# Patient Record
Sex: Female | Born: 1950 | Race: White | Hispanic: No | Marital: Married | Smoking: Never smoker
Health system: Southern US, Community
[De-identification: ages and names within clinical notes are randomized; demographics above are authoritative.]

## PROBLEM LIST (undated history)

## (undated) DIAGNOSIS — G5601 Carpal tunnel syndrome, right upper limb: Secondary | ICD-10-CM

## (undated) DIAGNOSIS — M2550 Pain in unspecified joint: Secondary | ICD-10-CM

## (undated) DIAGNOSIS — E039 Hypothyroidism, unspecified: Secondary | ICD-10-CM

## (undated) DIAGNOSIS — C9590 Leukemia, unspecified not having achieved remission: Secondary | ICD-10-CM

## (undated) DIAGNOSIS — E041 Nontoxic single thyroid nodule: Secondary | ICD-10-CM

## (undated) HISTORY — DX: Nontoxic single thyroid nodule: E04.1

## (undated) HISTORY — PX: NO PAST SURGERIES: SHX2092

## (undated) HISTORY — DX: Pain in unspecified joint: M25.50

## (undated) HISTORY — DX: Leukemia, unspecified not having achieved remission: C95.90

## (undated) HISTORY — DX: Hypothyroidism, unspecified: E03.9

## (undated) HISTORY — DX: Carpal tunnel syndrome, right upper limb: G56.01

---

## 1999-05-02 ENCOUNTER — Other Ambulatory Visit: Admission: RE | Admit: 1999-05-02 | Discharge: 1999-05-02 | Payer: Self-pay | Admitting: Family Medicine

## 1999-05-28 ENCOUNTER — Ambulatory Visit (HOSPITAL_COMMUNITY): Admission: RE | Admit: 1999-05-28 | Discharge: 1999-05-28 | Payer: Self-pay | Admitting: Family Medicine

## 1999-05-28 ENCOUNTER — Encounter: Payer: Self-pay | Admitting: Family Medicine

## 2017-01-27 ENCOUNTER — Telehealth: Payer: Self-pay | Admitting: Neurology

## 2017-01-27 NOTE — Telephone Encounter (Signed)
THIS PATIENT HAS BEEN R/S FOR SOONER EMG PER YOUR REQUEST. APPT IS WITH [facility] 01/29/17.

## 2017-01-29 ENCOUNTER — Encounter: Payer: Self-pay | Admitting: Neurology

## 2017-01-29 ENCOUNTER — Ambulatory Visit (INDEPENDENT_AMBULATORY_CARE_PROVIDER_SITE_OTHER): Payer: BC Managed Care – PPO | Admitting: Neurology

## 2017-01-29 ENCOUNTER — Ambulatory Visit (INDEPENDENT_AMBULATORY_CARE_PROVIDER_SITE_OTHER): Payer: Self-pay | Admitting: Neurology

## 2017-01-29 DIAGNOSIS — G5601 Carpal tunnel syndrome, right upper limb: Secondary | ICD-10-CM

## 2017-01-29 HISTORY — DX: Carpal tunnel syndrome, right upper limb: G56.01

## 2017-01-29 NOTE — Procedures (Signed)
     HISTORY:  Heather Solis is a 66 year old patient with a one-month history of discomfort and numbness involving the right hand. The patient denies any neck pain or pain radiating down the right arm to the hand. The discomfort is worse in the evening hours and keeps her awake at night. More recently, she started to have similar symptoms on the left arm and hand. The patient is being evaluated for a possible neuropathy or a cervical radiculopathy.  NERVE CONDUCTION STUDIES:  Nerve conduction studies were performed on both upper extremities. The distal motor latencies for the median nerves were prolonged on the right, normal on the left, with a borderline normal motor amplitude on the right, normal on the left. The distal motor latencies and motor amplitudes for the ulnar nerves bilaterally were normal. The F wave latencies and nerve conduction velocities for the median nerves bilaterally and for the ulnar nerves were normal. Sensory latencies for the median nerves were prolonged on the right, normal on the left, and normal for the ulnar nerves bilaterally.  EMG STUDIES:  EMG study was performed on the right upper extremity:  The first dorsal interosseous muscle reveals 2 to 4 K units with full recruitment. No fibrillations or positive waves were noted. The abductor pollicis brevis muscle reveals 2 to 4 K units with full recruitment. No fibrillations or positive waves were noted. The extensor indicis proprius muscle reveals 1 to 3 K units with full recruitment. No fibrillations or positive waves were noted. The pronator teres muscle reveals 2 to 3 K units with full recruitment. No fibrillations or positive waves were noted. The biceps muscle reveals 1 to 2 K units with full recruitment. No fibrillations or positive waves were noted. The triceps muscle reveals 2 to 4 K units with full recruitment. No fibrillations or positive waves were noted. The anterior deltoid muscle reveals 2 to 3 K units  with full recruitment. No fibrillations or positive waves were noted. The cervical paraspinal muscles were tested at 2 levels. No abnormalities of insertional activity were seen at either level tested. There was good relaxation.   IMPRESSION:  Nerve conduction studies done on both upper extremities reveal evidence of a mild right carpal tunnel syndrome. EMG evaluation of the right upper extremity was unremarkable, without evidence of an overlying cervical radiculopathy.  Jill Alexanders MD 01/29/2017 1:50 PM  [facility] [address] [place], [place] [address]  Phone [phone] Fax [phone]

## 2017-01-29 NOTE — Progress Notes (Signed)
Please refer to EMG and nerve conduction study procedure note. 

## 2017-02-14 ENCOUNTER — Other Ambulatory Visit: Payer: Self-pay | Admitting: Internal Medicine

## 2017-02-14 DIAGNOSIS — E042 Nontoxic multinodular goiter: Secondary | ICD-10-CM

## 2017-02-20 ENCOUNTER — Ambulatory Visit
Admission: RE | Admit: 2017-02-20 | Discharge: 2017-02-20 | Disposition: A | Discharge status: Home / Self Care | Payer: BC Managed Care – PPO | Source: Ambulatory Visit | Attending: Internal Medicine | Admitting: Internal Medicine

## 2017-02-20 DIAGNOSIS — E042 Nontoxic multinodular goiter: Secondary | ICD-10-CM

## 2017-02-24 ENCOUNTER — Other Ambulatory Visit: Payer: Self-pay | Admitting: Internal Medicine

## 2017-02-24 DIAGNOSIS — E041 Nontoxic single thyroid nodule: Secondary | ICD-10-CM

## 2017-02-25 ENCOUNTER — Ambulatory Visit (INDEPENDENT_AMBULATORY_CARE_PROVIDER_SITE_OTHER): Payer: BC Managed Care – PPO | Admitting: Neurology

## 2017-02-25 ENCOUNTER — Encounter: Payer: Self-pay | Admitting: Neurology

## 2017-02-25 ENCOUNTER — Telehealth: Payer: Self-pay | Admitting: Neurology

## 2017-02-25 ENCOUNTER — Ambulatory Visit
Admission: RE | Admit: 2017-02-25 | Discharge: 2017-02-25 | Disposition: A | Discharge status: Home / Self Care | Payer: BC Managed Care – PPO | Source: Ambulatory Visit | Attending: Neurology | Admitting: Neurology

## 2017-02-25 ENCOUNTER — Encounter (INDEPENDENT_AMBULATORY_CARE_PROVIDER_SITE_OTHER): Payer: Self-pay

## 2017-02-25 DIAGNOSIS — M25541 Pain in joints of right hand: Secondary | ICD-10-CM

## 2017-02-25 DIAGNOSIS — M2550 Pain in unspecified joint: Secondary | ICD-10-CM | POA: Insufficient documentation

## 2017-02-25 HISTORY — DX: Pain in unspecified joint: M25.50

## 2017-02-25 MED ORDER — HYDROMORPHONE HCL 2 MG PO TABS: 2.0000 mg | ORAL_TABLET | Freq: Four times a day (QID) | ORAL | 0 refills | Status: DC | PRN

## 2017-02-25 NOTE — Telephone Encounter (Signed)
I called patient. X-ray the hand is normal, no evidence of bony destruction, consider another etiology such as scleroderma. The patient will be seen a rheumatologist Dr.

## 2017-02-25 NOTE — Progress Notes (Signed)
Reason for visit: Arm weakness  Referring physician: Dr. Tonye Pearson is a 66 y.o. female  History of present illness:  Ms. Altheide is a 66 year old white female with a history of onset of discomfort in the right hand and the shoulders that began in December 2017. The patient has had some numbness sensations on the right hand more so than the left, she has been sent here for EMG and nerve conduction study evaluation that showed evidence of a mild right carpal tunnel syndrome without evidence of an overlying cervical radiculopathy. The patient is not have carpal tunnel syndrome on the left. The patient has some minor discomfort in the neck, she denies any low back pain. She has had some pain when she tries to elevate the arms, with discomfort into the shoulders and upper arms. The patient has essentially lost the use of her right hand, with swelling and discomfort in the joints of the fingers, and inability to fully close the hand to make a fist. The patient has a history of CML, she has been on [place] for this. The patient has had some elevation in white blood count recently, but there is no evidence of recurrence of the CML. The patient denies any balance changes, difficulty controlling the bowels or the bladder, the patient denies any headaches. She is sent to this office for further evaluation. She has undergone MRI of the cervical spine that did not show evidence of nerve root impingement or spinal cord impingement.  Past Medical History:  Diagnosis Date  . Arthralgia 02/25/2017  . Carpal tunnel syndrome on right 01/29/2017  . Hypothyroid   . 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 Thyroid cyst     Past Surgical History:  Procedure Laterality Date  . NO PAST SURGERIES      Family History  Problem Relation Age of Onset  . Lung cancer Father     Social history:  reports that she has never smoked. She has never used smokeless tobacco. She reports that she does not drink alcohol or use  drugs.  Medications:  Prior to Admission medications   Medication Sig Start Date End Date Taking? Authorizing Provider  calcium carbonate (CALCIUM 600) 600 MG TABS tablet Take 1 tablet by mouth daily.   Yes Historical Provider, MD  cetirizine (ZYRTEC) 10 MG tablet Take 10 mg by mouth daily.   Yes Historical Provider, MD  levothyroxine (SYNTHROID, LEVOTHROID) 125 MCG tablet Take 125 mcg by mouth daily. 01/23/17  Yes Historical Provider, MD  HYDROmorphone (DILAUDID) 2 MG tablet Take 1 tablet (2 mg total) by mouth every 6 (six) hours as needed for severe pain. 02/25/17   Kathrynn Ducking, MD     Not on File  ROS:  Out of a complete 14 system review of symptoms, the patient complains only of the following symptoms, and all other reviewed systems are negative.  Numbness, weakness Sleepiness Achy muscles Runny nose  Blood pressure (!) 148/80, pulse 81, height 5\' 3"  (1.6 m), weight 133 lb 8 oz (60.6 kg).  Physical Exam  General: The patient is alert and cooperative at the time of the examination.  Eyes: Pupils are equal, round, and reactive to light. Discs are flat bilaterally.  Neck: The neck is supple, no carotid bruits are noted.  Respiratory: The respiratory examination is clear.  Cardiovascular: The cardiovascular examination reveals a regular rate and rhythm, no obvious murmurs or rubs are noted.  Neuromuscular: The right hand is warm,  the fingers are swollen, stiff. There is decreased range of movement with flexion of the fingers on the right hand. With internal and external rotation of the shoulder joints, there is significant discomfort.  Skin: Extremities are without significant edema.  Neurologic Exam  Mental status: The patient is alert and oriented x 3 at the time of the examination. The patient has apparent normal recent and remote memory, with an apparently normal attention span and concentration ability.  Cranial nerves: Facial symmetry is present. There is good  sensation of the face to pinprick and soft touch bilaterally. The strength of the facial muscles and the muscles to head turning and shoulder shrug are normal bilaterally. Speech is well enunciated, no aphasia or dysarthria is noted. Extraocular movements are full. Visual fields are full. The tongue is midline, and the patient has symmetric elevation of the soft palate. No obvious hearing deficits are noted.  Motor: The motor testing reveals 5 over 5 strength of all 4 extremities. Good symmetric motor tone is noted throughout.  Sensory: Sensory testing is intact to pinprick, soft touch, vibration sensation, and position sense on all 4 extremities. No evidence of extinction is noted.  Coordination: Cerebellar testing reveals good finger-nose-finger and heel-to-shin bilaterally.  Gait and station: Gait is normal. Tandem gait is normal. Romberg is negative. No drift is seen.  Reflexes: Deep tendon reflexes are symmetric and normal bilaterally. Toes are downgoing bilaterally.   Assessment/Plan:  1. Arthralgias, arthritis  The patient does have mild right carpal tunnel syndrome, but her current symptoms are related to a rheumatologic issue involving the right hand predominantly, but also involving the shoulders. The patient will need to be evaluated for this issue, blood work will be done today, will get an x-ray of the right hand, and the patient will remain on [place] taking 2 tablets twice daily. The patient will be given Dilaudid for the discomfort, she could not tolerate Ultram previously.  Jill Alexanders MD 02/25/2017 8:39 AM  [facility] [address] [place], [place] [address]  Phone [phone] Fax [phone]

## 2017-02-26 ENCOUNTER — Telehealth: Payer: Self-pay | Admitting: *Deleted

## 2017-02-26 LAB — SEDIMENTATION RATE: Sed Rate: 11 mm/hr (ref 0–40)

## 2017-02-26 LAB — PAN-ANCA
ANCA Proteinase 3: <3.5 U/mL (ref 0.0–3.5)
C-ANCA: <1:20 {titer}
Myeloperoxidase Ab: <9 U/mL (ref 0.0–9.0)

## 2017-02-26 LAB — ANA W/REFLEX: Anti Nuclear Antibody(ANA): NEGATIVE

## 2017-02-26 LAB — B. BURGDORFI ANTIBODIES: Lyme IgG/IgM Ab: <0.91 {ISR} (ref 0.00–0.90)

## 2017-02-26 LAB — ANGIOTENSIN CONVERTING ENZYME: ANGIO CONVERT ENZYME: 63 U/L (ref 14–82)

## 2017-02-26 LAB — RHEUMATOID FACTOR

## 2017-02-26 NOTE — Telephone Encounter (Addendum)
Called and spoke with pt about unremarkable labs per CW,MD note. She verbalized understanding.   I gave her phone number to [facility] office to call if needed about rheumatology referral: Telephone [phone]. She may call back with different name/office to be referred to if she can get in sooner somewhere else.

## 2017-02-26 NOTE — Telephone Encounter (Signed)
-----   Message from Kathrynn Ducking, MD sent at 02/26/2017  4:52 PM EST -----  The blood work results are unremarkable. Please call the patient.  ----- Message ----- From: Lavone Neri Lab Results In Sent: 02/26/2017   7:43 AM To: Kathrynn Ducking, MD

## 2017-02-27 ENCOUNTER — Ambulatory Visit: Payer: BC Managed Care – PPO | Admitting: Neurology

## 2017-02-27 ENCOUNTER — Other Ambulatory Visit (HOSPITAL_COMMUNITY)
Admission: RE | Admit: 2017-02-27 | Discharge: 2017-02-27 | Disposition: A | Discharge status: Home / Self Care | Payer: BC Managed Care – PPO | Source: Ambulatory Visit | Attending: Internal Medicine | Admitting: Internal Medicine

## 2017-02-27 ENCOUNTER — Ambulatory Visit
Admission: RE | Admit: 2017-02-27 | Discharge: 2017-02-27 | Disposition: A | Discharge status: Home / Self Care | Payer: BC Managed Care – PPO | Source: Ambulatory Visit | Attending: Internal Medicine | Admitting: Internal Medicine

## 2017-02-27 DIAGNOSIS — E041 Nontoxic single thyroid nodule: Secondary | ICD-10-CM

## 2017-03-05 ENCOUNTER — Encounter: Payer: BC Managed Care – PPO | Admitting: Neurology

## 2017-03-14 ENCOUNTER — Ambulatory Visit: Payer: Self-pay | Admitting: Rheumatology

## 2017-03-17 ENCOUNTER — Ambulatory Visit (INDEPENDENT_AMBULATORY_CARE_PROVIDER_SITE_OTHER): Payer: Self-pay

## 2017-03-17 ENCOUNTER — Ambulatory Visit (INDEPENDENT_AMBULATORY_CARE_PROVIDER_SITE_OTHER): Payer: BC Managed Care – PPO | Admitting: Rheumatology

## 2017-03-17 ENCOUNTER — Ambulatory Visit (HOSPITAL_COMMUNITY)
Admission: RE | Admit: 2017-03-17 | Discharge: 2017-03-17 | Disposition: A | Discharge status: Home / Self Care | Payer: BC Managed Care – PPO | Source: Ambulatory Visit | Attending: Rheumatology | Admitting: Rheumatology

## 2017-03-17 ENCOUNTER — Encounter: Payer: Self-pay | Admitting: Rheumatology

## 2017-03-17 VITALS — BP 145/87 | HR 70 | Resp 12 | Ht 62.0 in | Wt 136.0 lb

## 2017-03-17 DIAGNOSIS — G8929 Other chronic pain: Secondary | ICD-10-CM | POA: Diagnosis not present

## 2017-03-17 DIAGNOSIS — M25512 Pain in left shoulder: Secondary | ICD-10-CM

## 2017-03-17 DIAGNOSIS — E039 Hypothyroidism, unspecified: Secondary | ICD-10-CM | POA: Diagnosis not present

## 2017-03-17 DIAGNOSIS — Z139 Encounter for screening, unspecified: Secondary | ICD-10-CM

## 2017-03-17 DIAGNOSIS — R5383 Other fatigue: Secondary | ICD-10-CM

## 2017-03-17 DIAGNOSIS — E041 Nontoxic single thyroid nodule: Secondary | ICD-10-CM | POA: Diagnosis not present

## 2017-03-17 DIAGNOSIS — M79642 Pain in left hand: Secondary | ICD-10-CM

## 2017-03-17 DIAGNOSIS — C9210 Chronic myeloid leukemia, BCR/ABL-positive, not having achieved remission: Secondary | ICD-10-CM

## 2017-03-17 DIAGNOSIS — M79641 Pain in right hand: Secondary | ICD-10-CM | POA: Diagnosis not present

## 2017-03-17 DIAGNOSIS — M25511 Pain in right shoulder: Secondary | ICD-10-CM

## 2017-03-17 LAB — CBC WITH DIFFERENTIAL/PLATELET
BASOS PCT: 0 %
Basophils Absolute: 0 cells/uL (ref 0–200)
EOS PCT: 2 %
Eosinophils Absolute: 152 cells/uL (ref 15–500)
HEMATOCRIT: 40.8 % (ref 35.0–45.0)
Hemoglobin: 13.5 g/dL (ref 11.7–15.5)
LYMPHS ABS: 2280 {cells}/uL (ref 850–3900)
Lymphocytes Relative: 30 %
MCH: 30 pg (ref 27.0–33.0)
MCHC: 33.1 g/dL (ref 32.0–36.0)
MCV: 90.7 fL (ref 80.0–100.0)
MONO ABS: 760 {cells}/uL (ref 200–950)
MPV: 9.6 fL (ref 7.5–12.5)
Monocytes Relative: 10 %
Neutro Abs: 4408 cells/uL (ref 1500–7800)
Neutrophils Relative %: 58 %
Platelets: 365 10*3/uL (ref 140–400)
RBC: 4.5 MIL/uL (ref 3.80–5.10)
RDW: 13.4 % (ref 11.0–15.0)
WBC: 7.6 10*3/uL (ref 3.8–10.8)

## 2017-03-17 LAB — COMPLETE METABOLIC PANEL WITH GFR
ALBUMIN: 4.3 g/dL (ref 3.6–5.1)
ALT: 12 U/L (ref 6–29)
AST: 17 U/L (ref 10–35)
Alkaline Phosphatase: 75 U/L (ref 33–130)
BUN: 15 mg/dL (ref 7–25)
CALCIUM: 10.6 mg/dL — AB (ref 8.6–10.4)
CHLORIDE: 103 mmol/L (ref 98–110)
CO2: 25 mmol/L (ref 20–31)
CREATININE: 0.71 mg/dL (ref 0.50–0.99)
GFR, Est African American: >89 mL/min (ref >=60)
GFR, Est Non African American: >89 mL/min (ref >=60)
GLUCOSE: 88 mg/dL (ref 65–99)
POTASSIUM: 4.2 mmol/L (ref 3.5–5.3)
SODIUM: 141 mmol/L (ref 135–146)
Total Bilirubin: 0.6 mg/dL (ref 0.2–1.2)
Total Protein: 7.4 g/dL (ref 6.1–8.1)

## 2017-03-17 NOTE — Progress Notes (Signed)
Chest x-ray normal, notify patient

## 2017-03-17 NOTE — Progress Notes (Signed)
Office Visit Note  Patient: Heather Solis             Date of Birth: June 29, 1951           MRN: [id number]             PCP: Leonard Downing, MD Referring: Leonard Downing, * Visit Date: 03/17/2017 Occupation: @GUAROCC @    Subjective: Pain hands   History of Present Illness: Heather Solis is a 66 y.o. female is self referred herself for evaluation of her symptoms. According to her she started having carpal tunnel syndrome symptoms in her right hand about 6 months ago gradually she started having symptoms in her left hand as well. She states the pain is so severe that she has difficulty sleeping. Over time she has tried Tylenol, ibuprofen and Aleve. She states in January 2018 she was seen by her PCP who prescribed tramadol but it caused nausea and she had discontinued the medication. She was referred to Dr. Jannifer Franklin who did nerve conduction velocities and told her that she had mild carpal tunnel syndrome in her right hand and no carpal tunnel syndrome in her left hand. She went to see Dr. Mauri Brooklyn for possible surgery or injection for the carpal tunnel syndrome. After evaluation Dr. Sherwood Gambler felt that she had decreased grip strength and weakness in her hands and advised reevaluation by Dr. Jannifer Franklin. She had an appointment with Dr. Jannifer Franklin who did x-ray of her C-spine and MRI for C-spine which showed mild DDD. It also showed thyroid nodule for which she had biopsy. She states Dr. Jannifer Franklin gave her some Dilaudid for pain. 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 She stopped the medication in October she was doing well. She believes her symptoms is started after stopping Gleevec. She denies pain and discomfort in any other joints.  Activities of Daily Living:  Patient reports morning stiffness for all day hours.   Patient Reports nocturnal pain.  Difficulty dressing/grooming: Denies Difficulty climbing stairs: Denies Difficulty getting out of chair: Denies Difficulty  using hands for taps, buttons, cutlery, and/or writing: Reports   Review of Systems  Constitutional: Negative for fatigue, night sweats, weight gain, weight loss and weakness.  HENT: Positive for mouth sores. Negative for trouble swallowing, trouble swallowing, mouth dryness and nose dryness.   Eyes: Negative for pain, redness, visual disturbance and dryness.  Respiratory: Negative for cough, shortness of breath and difficulty breathing.   Cardiovascular: Negative for chest pain, palpitations, hypertension, irregular heartbeat and swelling in legs/feet.  Gastrointestinal: Positive for constipation. Negative for blood in stool and diarrhea.       Due to medication  Endocrine: Negative for increased urination.  Genitourinary: Negative for vaginal dryness.  Musculoskeletal: Positive for arthralgias, joint pain and joint swelling. Negative for myalgias, muscle weakness, morning stiffness, muscle tenderness and myalgias.  Skin: Positive for color change. Negative for rash, hair loss, skin tightness, ulcers and sensitivity to sunlight.  Allergic/Immunologic: Negative for susceptible to infections.  Neurological: Negative for dizziness, memory loss and night sweats.  Hematological: Negative for swollen glands.  Psychiatric/Behavioral: Positive for sleep disturbance. Negative for depressed mood. The patient is not nervous/anxious.     PMFS History:  Patient Active Problem List   Diagnosis Date Noted  . Pain in both hands 03/17/2017  . 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 Acquired hypothyroidism 03/17/2017  . Thyroid nodule 03/17/2017  . Arthralgia 02/25/2017  . Carpal tunnel syndrome on right 01/29/2017  Past Medical History:  Diagnosis Date  . Arthralgia 02/25/2017  . Carpal tunnel syndrome on right 01/29/2017  . Hypothyroid   . Leukemia ([place])    CML  . Thyroid cyst     Family History  Problem Relation Age of Onset  . Lung cancer Father    Past Surgical History:    Procedure Laterality Date  . NO PAST SURGERIES     Social History   Social History Narrative   Lives with husband   Caffeine use: 2 cups coffee/morning   Diet pepsi during day   Tea ocass   Right-handed     Objective: Vital Signs: BP (!) 145/87 (BP Location: Right Arm, Patient Position: Sitting, Cuff Size: Normal)   Pulse 70   Resp 12   Ht 5\' 2"  (1.575 m)   Wt 136 lb (61.7 kg)   BMI 24.87 kg/m    Physical Exam  Constitutional: She is oriented to person, place, and time. She appears well-developed and well-nourished.  HENT:  Head: Normocephalic and atraumatic.  Eyes: Conjunctivae and EOM are normal.  Neck: Normal range of motion.  Cardiovascular: Normal rate, regular rhythm, normal heart sounds and intact distal pulses.   Pulmonary/Chest: Effort normal and breath sounds normal.  Abdominal: Soft. Bowel sounds are normal.  Lymphadenopathy:    She has no cervical adenopathy.  Neurological: She is alert and oriented to person, place, and time.  Skin: Skin is warm and dry. Capillary refill takes less than 2 seconds.  She is very tight and thickened skin on her right hand. There are a few telangiectasias were noted. No Raynaud's phenomena and was noted. Her oral aperture  was 3 cm  Psychiatric: She has a normal mood and affect. Her behavior is normal.  Nursing note and vitals reviewed.    Musculoskeletal Exam: C-spine and thoracic lumbar spine good range of motion. Her bilateral shoulder joint abduction was limited to 90 elbow joints are good range of motion. Her right wrist joint extension was 30 and flexion 40 left wrist joint extension was 60 and flexion 60 she has some thickening of PIP/DIP joints with no synovitis. Hip joints knee joints ankles MTPs PIPs with good range of motion with no synovitis.  CDAI Exam: No CDAI exam completed.    Investigation: No additional findings.   Imaging: Dg Hand 2 View Right  Result Date: 02/25/2017 CLINICAL DATA:  Arthralgia,  right hand pain EXAM: RIGHT HAND - 2 VIEW COMPARISON:  None. FINDINGS: No acute bony abnormality. Specifically, no fracture, subluxation, or dislocation. Soft tissues are intact. Joint spaces are maintained. No bony erosions. Soft tissues are intact. IMPRESSION: No acute bony abnormality. Electronically Signed   By: Rolm Baptise M.D.   On: 02/25/2017 09:27   US Thyroid Biopsy  Result Date: 02/27/2017 INDICATION: Indeterminate thyroid nodule of the right mid thyroid and left mid thyroid. Request is made for fine need aspiration biopsy of bilateral thyroid nodules. EXAM: ULTRASOUND GUIDED FINE NEEDLE ASPIRATION OF INDETERMINATE RIGHT MID THYROID NODULE ULTRASOUND GUIDED FINE NEEDLE ASPIRATION OF INDETERMINATE LEFT MID THYROID NODULE COMPARISON:  US Thyroid 02/21/2017 MEDICATIONS: 5 mL 1% lidocaine COMPLICATIONS: None immediate. TECHNIQUE: Informed written consent was obtained from the patient after a discussion of the risks, benefits and alternatives to treatment. Questions regarding the procedure were encouraged and answered. A timeout was performed prior to the initiation of the procedure. Pre-procedural ultrasound scanning demonstrated unchanged size and appearance of the indeterminate nodules within the right mid and left mid thyroid. The procedure  was planned. The neck was prepped in the usual sterile fashion, and a sterile drape was applied covering the operative field. A timeout was performed prior to the initiation of the procedure. Local anesthesia was provided with 1% lidocaine. Under direct ultrasound guidance, 5 FNA biopsies were performed of the left mid with a 25 gauge needle. Multiple ultrasound images were saved for procedural documentation purposes. The samples were prepared and submitted to pathology. Under direct ultrasound guidance, 4 FNA biopsies were performed of the right mid with a 25 gauge needle. Multiple ultrasound images were saved for procedural documentation purposes. The samples were  prepared and submitted to pathology. Limited post procedural scanning was negative for hematoma or additional complication. Dressings were placed. The patient tolerated the above procedures procedure well without immediate postprocedural complication. FINDINGS: FINDINGS Nodule reference number based on prior diagnostic ultrasound: 1 Maximum size: 3.2 Location: Right; Mid ACR TI-RADS risk category: TR4 (4-6 points) Reason for biopsy: meets ACR TI-RADS criteria _________________________________________________________ Nodule reference number based on prior diagnostic ultrasound: 2 Maximum size: 3.0 Location: Left; Mid ACR TI-RADS risk category: TR3 (3 points) Reason for biopsy: meets ACR TI-RADS criteria Ultrasound imaging confirms appropriate placement of the needles within the thyroid nodule. IMPRESSION: 1. Technically successful ultrasound guided fine needle aspiration of right mid thyroid nodule. 2. Technically successful ultrasound guided fine needle aspiration of left mid thyroid nodule. Read by:  Brynda Greathouse PA-C Electronically Signed   By: Aletta Edouard M.D.   On: 02/27/2017 16:41   US Thyroid Biopsy  Result Date: 02/27/2017 INDICATION: Indeterminate thyroid nodule of the right mid thyroid and left mid thyroid. Request is made for fine need aspiration biopsy of bilateral thyroid nodules. EXAM: ULTRASOUND GUIDED FINE NEEDLE ASPIRATION OF INDETERMINATE RIGHT MID THYROID NODULE ULTRASOUND GUIDED FINE NEEDLE ASPIRATION OF INDETERMINATE LEFT MID THYROID NODULE COMPARISON:  US Thyroid 02/21/2017 MEDICATIONS: 5 mL 1% lidocaine COMPLICATIONS: None immediate. TECHNIQUE: Informed written consent was obtained from the patient after a discussion of the risks, benefits and alternatives to treatment. Questions regarding the procedure were encouraged and answered. A timeout was performed prior to the initiation of the procedure. Pre-procedural ultrasound scanning demonstrated unchanged size and appearance of the  indeterminate nodules within the right mid and left mid thyroid. The procedure was planned. The neck was prepped in the usual sterile fashion, and a sterile drape was applied covering the operative field. A timeout was performed prior to the initiation of the procedure. Local anesthesia was provided with 1% lidocaine. Under direct ultrasound guidance, 5 FNA biopsies were performed of the left mid with a 25 gauge needle. Multiple ultrasound images were saved for procedural documentation purposes. The samples were prepared and submitted to pathology. Under direct ultrasound guidance, 4 FNA biopsies were performed of the right mid with a 25 gauge needle. Multiple ultrasound images were saved for procedural documentation purposes. The samples were prepared and submitted to pathology. Limited post procedural scanning was negative for hematoma or additional complication. Dressings were placed. The patient tolerated the above procedures procedure well without immediate postprocedural complication. FINDINGS: FINDINGS Nodule reference number based on prior diagnostic ultrasound: 1 Maximum size: 3.2 Location: Right; Mid ACR TI-RADS risk category: TR4 (4-6 points) Reason for biopsy: meets ACR TI-RADS criteria _________________________________________________________ Nodule reference number based on prior diagnostic ultrasound: 2 Maximum size: 3.0 Location: Left; Mid ACR TI-RADS risk category: TR3 (3 points) Reason for biopsy: meets ACR TI-RADS criteria Ultrasound imaging confirms appropriate placement of the needles within the thyroid nodule. IMPRESSION: 1. Technically successful  ultrasound guided fine needle aspiration of right mid thyroid nodule. 2. Technically successful ultrasound guided fine needle aspiration of left mid thyroid nodule. Read by:  Brynda Greathouse PA-C Electronically Signed   By: Aletta Edouard M.D.   On: 02/27/2017 16:41   US Thyroid  Result Date: 02/21/2017 CLINICAL DATA:  Incidental on MRI. Nodule  identified on cervical spine MRI. EXAM: THYROID ULTRASOUND TECHNIQUE: Ultrasound examination of the thyroid gland and adjacent soft tissues was performed. COMPARISON:  MRI 01/31/2017 FINDINGS: Parenchymal Echotexture: Moderately heterogenous Isthmus: Measures 0.4 cm in the AP dimension. Right lobe: 4.2 x 2.8 x 2.5 cm Left lobe: 4.7 x 2.0 x 2.5 cm _________________________________________________________ Estimated total number of nodules >/= 1 cm: 2 Number of spongiform nodules >/=  2 cm not described below (TR1): 0 Number of mixed cystic and solid nodules >/= 1.5 cm not described below (TR2): 0 Nodule # 1: Location: Right; Mid Maximum size: 3.2 cm; Other 2 dimensions: 2.5 x 2.1 cm Composition: mixed cystic and solid (1) Echogenicity: hypoechoic (2) Shape: taller-than-wide (3) Margins: ill-defined (0) Echogenic foci: none (0) ACR TI-RADS total points: 6. ACR TI-RADS risk category: TR4 (4-6 points). ACR TI-RADS recommendations: **Given size (>/= 1.5 cm) and appearance, fine needle aspiration of this moderately suspicious nodule should be considered based on TI-RADS criteria. Nodule # 2: Location: Left; Mid Maximum size: 3.0 cm; Other 2 dimensions: 1.7 x 2.9 cm Composition: mixed cystic and solid (1) Echogenicity: hypoechoic (2) Shape: not taller-than-wide (0) Margins: smooth (0) Echogenic foci: large comet-tail artifacts (0) ACR TI-RADS total points: 3. ACR TI-RADS risk category: TR3 (3 points). ACR TI-RADS recommendations: **Given size (>/= 2.5 cm) and appearance, fine needle aspiration of this mildly suspicious nodule should be considered based on TI-RADS criteria. IMPRESSION: Large bilateral complex thyroid nodules. Both of these dominant nodules meet criteria for ultrasound-guided biopsy. The above is in keeping with the ACR TI-RADS recommendations - J Am Coll Radiol 2017;14:587-595. Electronically Signed   By: Markus Daft M.D.   On: 02/21/2017 08:19   Xr Hand 2 View Left  Result Date: 03/17/2017 Mild PIP/DIP  narrowing was noted. No MCP or intercarpal joint space narrowing was noted. No erosive changes were noted. 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 Mild before acromioclavicular joint narrowing was noted. No chondrocalcinosis was noted.  Xr Shoulder Right  Result Date: 03/17/2017 No glenohumeral joint space narrowing was noted. Mild before acromioclavicular joint narrowing was noted. No chondrocalcinosis was noted.   Speciality Comments: No specialty comments available.    Procedures:  No procedures performed Allergies: Patient has no known allergies.   Assessment / Plan:     Visit Diagnoses: Pain in both hands -patient has been complaining of severe pain in her right hand and moderate pain in her left hand to the point she is on nocturnal pain. Her nerve conduction velocity showed only mild carpal tunnel syndrome in her right hand. She had no synovitis in her hands to day on exam. 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 She had limited range of motion of her bilateral shoulders bilateral wrist joints and right hand she is incomplete fist formation. I'll obtain following labs today. If labs are negative we will refer her to dermatology for a skin biopsy of her right hand. I saw the report of her right hand x-ray which was unremarkable. Plan: XR Hand 2 View Left, which showed only  mild osteoarthritis. 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 She believes her symptoms is started after stopping Gleevec  Acquired hypothyroidism  Thyroid nodule  Chronic pain of both shoulders - Plan: XR Shoulder Left, XR Shoulder Right.  Her x-rays were unremarkable  Other fatigue - Plan: CBC with Differential/Platelet, COMPLETE METABOLIC PANEL WITH GFR, Urinalysis, Routine w reflex microscopic, CK    Orders: Orders Placed This Encounter  Procedures  . XR Shoulder Left  . XR Shoulder Right  . XR Hand 2 View Left  . DG Chest 2 View  . CBC with Differential/Platelet  . COMPLETE METABOLIC PANEL WITH GFR  . Urinalysis, Routine w reflex microscopic  . CK  . Cyclic citrul peptide antibody, IgG  . 14-3-3 eta Protein  . Serum protein electrophoresis with reflex  . IgG, IgA, IgM  . C3 and C4  . [id number] ENA PANEL   No orders of the defined types were placed in this encounter.   Face-to-face time spent with patient was 50 minutes. 50% of time was spent in counseling and coordination of care.  Follow-Up Instructions: Return in about 2 weeks (around 03/31/2017) for Pain hands.   Bo Merino, MD  Note - This record has been created using Editor, commissioning.  Chart creation errors have been sought, but may not always  have been located. Such creation errors do not reflect on  the standard of medical care.

## 2017-03-18 ENCOUNTER — Telehealth: Payer: Self-pay | Admitting: Neurology

## 2017-03-18 LAB — CK: CK TOTAL: 76 U/L (ref 7–177)

## 2017-03-18 LAB — IGG, IGA, IGM
IGG (IMMUNOGLOBIN G), SERUM: 1198 mg/dL (ref 694–1618)
IgA: 220 mg/dL (ref 81–463)
IgM, Serum: 30 mg/dL — ABNORMAL LOW (ref 48–271)

## 2017-03-18 LAB — CP5000020 ENA PANEL
ENA SM Ab Ser-aCnc: <1
Ribonucleic Protein(ENA) Antibody, IgG: <1
SCLERODERMA (SCL-70) (ENA) ANTIBODY, IGG: NEGATIVE
SSA (RO) (ENA) ANTIBODY, IGG: NEGATIVE
SSB (LA) (ENA) ANTIBODY, IGG: NEGATIVE
ds DNA Ab: <1 IU/mL

## 2017-03-18 LAB — URINALYSIS, ROUTINE W REFLEX MICROSCOPIC
BILIRUBIN URINE: NEGATIVE
GLUCOSE, UA: NEGATIVE
Hgb urine dipstick: NEGATIVE
KETONES UR: NEGATIVE
Leukocytes, UA: NEGATIVE
Nitrite: NEGATIVE
PH: 6 (ref 5.0–8.0)
PROTEIN: NEGATIVE
Specific Gravity, Urine: 1.01 (ref 1.001–1.035)

## 2017-03-18 LAB — CYCLIC CITRUL PEPTIDE ANTIBODY, IGG

## 2017-03-18 LAB — C3 AND C4
C3 Complement: 159 mg/dL (ref 83–193)
C4 COMPLEMENT: 19 mg/dL (ref 15–57)

## 2017-03-18 NOTE — Telephone Encounter (Signed)
Called and spoke with pt. Advised her appt cx at Dr Trudie Reed office as requested. She verbalized understanding and appreciation.   She is scheduled next week with Dr Estanislado Pandy instead.

## 2017-03-18 NOTE — Telephone Encounter (Signed)
Lori @ [facility] called to inform Terrence Dupont that yes she received the message. She said no need to call back but if need be she can be reached at [phone]

## 2017-03-18 NOTE — Telephone Encounter (Signed)
Tried calling [facility] office at [phone]. Went to VM. Will try again later.

## 2017-03-18 NOTE — Telephone Encounter (Signed)
Noted, thank you. See other message from today. Pt cx appt with them.

## 2017-03-18 NOTE — Telephone Encounter (Signed)
Called and LVM advising pt would like to cx her appt. She went to different rheumatologist. Asked them to call back to make sure they received message. Gave [facility] phone number.

## 2017-03-18 NOTE — Telephone Encounter (Signed)
Pt has called to cancel the appointment with Heather Solis.) , she stated she has tried  The number several times and can not get through.  She is asking if you can call the office for her to cancel the appointment since she has seen another rheumatologist

## 2017-03-19 LAB — PROTEIN ELECTROPHORESIS, SERUM, WITH REFLEX
ALPHA-1-GLOBULIN: 0.5 g/dL — AB (ref 0.2–0.3)
Albumin ELP: 4.1 g/dL (ref 3.8–4.8)
Alpha-2-Globulin: 0.8 g/dL (ref 0.5–0.9)
BETA 2: 0.3 g/dL (ref 0.2–0.5)
Beta Globulin: 0.5 g/dL (ref 0.4–0.6)
GAMMA GLOBULIN: 1.1 g/dL (ref 0.8–1.7)
TOTAL PROTEIN, SERUM ELECTROPHOR: 7.4 g/dL (ref 6.1–8.1)

## 2017-03-20 NOTE — Progress Notes (Addendum)
Office Visit Note  Patient: Heather Solis             Date of Birth: 1951-03-25           MRN: [id number]             PCP: Leonard Downing, MD Referring: Leonard Downing, * Visit Date: 03/31/2017 Occupation: @GUAROCC @    Subjective:  Pain hands   History of Present Illness: Heather Solis is a 66 y.o. female and returns a follow-up visit today she states she continues to have pain and discomfort in her bilateral hands more so in the right hand. She also has some discomfort in her shoulders and upper arms. She denies any history of Raynaud's phenomenon. She states pain is more severe at night.  Activities of Daily Living:  Patient reports morning stiffness for 1 hour.   Patient Reports nocturnal pain.  Difficulty dressing/grooming: Denies Difficulty climbing stairs: Denies Difficulty getting out of chair: Denies Difficulty using hands for taps, buttons, cutlery, and/or writing: Reports   Review of Systems  Constitutional: Positive for fatigue. Negative for night sweats, weight gain, weight loss and weakness.  HENT: Negative for mouth sores, trouble swallowing, trouble swallowing, mouth dryness and nose dryness.   Eyes: Negative for pain, redness, visual disturbance and dryness.  Respiratory: Negative for cough, shortness of breath and difficulty breathing.   Cardiovascular: Negative for chest pain, palpitations, hypertension, irregular heartbeat and swelling in legs/feet.  Gastrointestinal: Positive for constipation. Negative for blood in stool and diarrhea.  Endocrine: Negative for increased urination.  Genitourinary: Negative for vaginal dryness.  Musculoskeletal: Positive for arthralgias, joint pain and morning stiffness. Negative for joint swelling, myalgias, muscle weakness, muscle tenderness and myalgias.  Skin: Negative for color change, rash, hair loss, skin tightness, ulcers and sensitivity to sunlight.  Allergic/Immunologic: Negative for susceptible to  infections.  Neurological: Negative for dizziness, memory loss and night sweats.  Hematological: Negative for swollen glands.  Psychiatric/Behavioral: Positive for sleep disturbance. Negative for depressed mood. The patient is not nervous/anxious.     PMFS History:  Patient Active Problem List   Diagnosis Date Noted  . Pain in both hands 03/17/2017  . 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 Acquired hypothyroidism 03/17/2017  . Thyroid nodule 03/17/2017  . Arthralgia 02/25/2017  . Carpal tunnel syndrome on right 01/29/2017    Past Medical History:  Diagnosis Date  . Arthralgia 02/25/2017  . Carpal tunnel syndrome on right 01/29/2017  . Hypothyroid   . Leukemia ([place])    CML  . Thyroid cyst     Family History  Problem Relation Age of Onset  . Lung cancer Father    Past Surgical History:  Procedure Laterality Date  . NO PAST SURGERIES     Social History   Social History Narrative   Lives with husband   Caffeine use: 2 cups coffee/morning   Diet pepsi during day   Tea ocass   Right-handed     Objective: Vital Signs: BP 126/72   Pulse 68   Resp 16   Wt 136 lb (61.7 kg)   BMI 24.87 kg/m    Physical Exam  Constitutional: She is oriented to person, place, and time. She appears well-developed and well-nourished.  HENT:  Head: Normocephalic and atraumatic.  Eyes: Conjunctivae and EOM are normal.  Neck: Normal range of motion.  Cardiovascular: Normal rate, regular rhythm, normal heart sounds and intact distal pulses.   Pulmonary/Chest: Effort normal and breath sounds normal.  Abdominal:  Soft. Bowel sounds are normal.  Lymphadenopathy:    She has no cervical adenopathy.  Neurological: She is alert and oriented to person, place, and time.  Skin: Skin is warm and dry. Capillary refill takes less than 2 seconds.  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 Left hand is limited to proximal phalanx. 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 Her oral aperture is [place]. No obvious Raynauds phenomena and was noted.  Psychiatric: She has a normal mood and affect. Her behavior is normal.  Nursing note and vitals reviewed.    Musculoskeletal Exam:C-spine and thoracic lumbar spine good range of motion. Shoulder joints elbow joints wrist joints are good range of motion. She is incomplete fist formation in her bilateral hands for so in the right hand. Hip joints knee joints ankles MTPs PIPs with good range of motion.  CDAI Exam: No CDAI exam completed.    Investigation: Findings:  03/17/2017 CBC normal, CMP calcium 10.6, UA normal, CK normal, ENA negative, C3-C4 normal, IgM low at 30, SPEP normal, CCP negative, 14 33 eta negative 02/25/2017 ESR 11, ANA negative, RF negative, ace negative, pan ANCA negative   Office Visit on 03/17/2017  Component Date Value Ref Range Status  . WBC 03/17/2017 7.6  3.8 - 10.8 K/uL Final  . RBC 03/17/2017 4.50  3.80 - 5.10 MIL/uL Final  . Hemoglobin 03/17/2017 13.5  11.7 - 15.5 g/dL Final  . HCT 03/17/2017 40.8  35.0 - 45.0 % Final  . MCV 03/17/2017 90.7  80.0 - 100.0 fL Final  . MCH 03/17/2017 30.0  27.0 - 33.0 pg Final  . MCHC 03/17/2017 33.1  32.0 - 36.0 g/dL Final  . RDW 03/17/2017 13.4  11.0 - 15.0 % Final  . Platelets 03/17/2017 365  140 - 400 K/uL Final  . MPV 03/17/2017 9.6  7.5 - 12.5 fL Final  . Neutro Abs 03/17/2017 4408  1,500 - 7,800 cells/uL Final  . Lymphs Abs 03/17/2017 2280  850 - 3,900 cells/uL Final  . Monocytes Absolute 03/17/2017 760  200 - 950 cells/uL Final  . Eosinophils Absolute 03/17/2017 152  15 - 500 cells/uL Final  . Basophils Absolute 03/17/2017 0  0 - 200 cells/uL Final  . Neutrophils Relative % 03/17/2017 58  % Final  . Lymphocytes Relative 03/17/2017 30  % Final  . Monocytes Relative 03/17/2017 10  % Final  . Eosinophils Relative 03/17/2017 2  % Final  . Basophils Relative 03/17/2017 0  % Final  . Smear Review  03/17/2017 Criteria for review not met   Final  . Sodium 03/17/2017 141  135 - 146 mmol/L Final  . Potassium 03/17/2017 4.2  3.5 - 5.3 mmol/L Final  . Chloride 03/17/2017 103  98 - 110 mmol/L Final  . CO2 03/17/2017 25  20 - 31 mmol/L Final  . Glucose, Bld 03/17/2017 88  65 - 99 mg/dL Final  . BUN 03/17/2017 15  7 - 25 mg/dL Final  . Creat 03/17/2017 0.71  0.50 - 0.99 mg/dL Final   Comment:   For patients > or = 64 years of age: The upper reference limit for Creatinine is approximately 13% higher for people identified as African-American.     . Total Bilirubin 03/17/2017 0.6  0.2 - 1.2 mg/dL Final  . Alkaline Phosphatase 03/17/2017 75  33 - 130 U/L Final  . AST 03/17/2017 17  10 - 35 U/L Final  .  ALT 03/17/2017 12  6 - 29 U/L Final  . Total Protein 03/17/2017 7.4  6.1 - 8.1 g/dL Final  . Albumin 03/17/2017 4.3  3.6 - 5.1 g/dL Final  . Calcium 03/17/2017 10.6* 8.6 - 10.4 mg/dL Final  . GFR, Est African American 03/17/2017 >89  >=60 mL/min Final  . GFR, Est Non African American 03/17/2017 >89  >=60 mL/min Final  . Color, Urine 03/17/2017 YELLOW  YELLOW Final  . APPearance 03/17/2017 CLEAR  CLEAR Final  . Specific Gravity, Urine 03/17/2017 1.010  1.001 - 1.035 Final  . pH 03/17/2017 6.0  5.0 - 8.0 Final  . Glucose, UA 03/17/2017 NEGATIVE  NEGATIVE Final  . Bilirubin Urine 03/17/2017 NEGATIVE  NEGATIVE Final  . Ketones, ur 03/17/2017 NEGATIVE  NEGATIVE Final  . Hgb urine dipstick 03/17/2017 NEGATIVE  NEGATIVE Final  . Protein, ur 03/17/2017 NEGATIVE  NEGATIVE Final  . Nitrite 03/17/2017 NEGATIVE  NEGATIVE Final  . Leukocytes, UA 03/17/2017 NEGATIVE  NEGATIVE Final  . Total CK 03/17/2017 76  7 - 177 U/L Final  . Cyclic Citrullin Peptide Ab 03/17/2017 <16  Units Final   Comment:   Reference Range Negative               < 20 Weak Positive            20 - 39 Moderate Positive        40 - 59 Strong Positive        > 59   . 14-3-3 eta Protein 03/17/2017 <0.2  <0.2 ng/mL Final    Comment:   This test was developed and its analytical performance characteristics have been determined by [facility]. It has not been cleared or approved by FDA. This assay has been validated pursuant to the CLIA regulations and is used for clinical purposes.   . Total Protein, Serum Electrophores* 03/17/2017 7.4  6.1 - 8.1 g/dL Final  . Albumin ELP 03/17/2017 4.1  3.8 - 4.8 g/dL Final  . Alpha-1-Globulin 03/17/2017 0.5* 0.2 - 0.3 g/dL Final  . Alpha-2-Globulin 03/17/2017 0.8  0.5 - 0.9 g/dL Final  . Beta Globulin 03/17/2017 0.5  0.4 - 0.6 g/dL Final  . Beta 2 03/17/2017 0.3  0.2 - 0.5 g/dL Final  . Gamma Globulin 03/17/2017 1.1  0.8 - 1.7 g/dL Final  . Abnormal Protein Band1 03/17/2017 NOT DET  g/dL Final  . SPE Interp. 03/17/2017 SEE NOTE   Final   Comment: Suggestive of acute inflammation pattern with elevation of acute phase proteins. Reviewed by Odis Hollingshead, MD, PhD, FCAP (Electronic Signature on File)   . Abnormal Protein Band2 03/17/2017 NOT DET  g/dL Final  . Abnormal Protein Band3 03/17/2017 NOT DET  g/dL Final  . IgG (Immunoglobin G), Serum 03/17/2017 1198  694 - 1,618 mg/dL Final  . IgA 03/17/2017 220  81 - 463 mg/dL Final  . IgM, Serum 03/17/2017 30* 48 - 271 mg/dL Final  . C3 Complement 03/17/2017 159  83 - 193 mg/dL Final  . C4 Complement 03/17/2017 19  15 - 57 mg/dL Final  . ds DNA Ab 03/17/2017 <1  IU/mL Final   Comment:                                 IU/mL       Interpretation                               <  or = 4    Negative                               5-9         Indeterminate                               > or = 10   Positive     . Ribonucleic Protein(ENA) Antibody,* 03/17/2017 <1.0 NEG  <1.0 NEG AI Final  . Scleroderma (Scl-70) (ENA) Antibod* 03/17/2017 <1.0 NEG  <1.0 NEG AI Final  . SSA (Ro) (ENA) Antibody, IgG 03/17/2017 <1.0 NEG  <1.0 NEG AI Final  . SSB (La) (ENA) Antibody, IgG 03/17/2017 <1.0 NEG   <1.0 NEG AI Final  . ENA SM Ab Ser-aCnc 03/17/2017 <1.0 NEG  <1.0 NEG AI Final  Office Visit on 02/25/2017  Component Date Value Ref Range Status  . Angio Convert Enzyme 02/25/2017 63  14 - 82 U/L Final  . Rhuematoid fact SerPl-aCnc 02/25/2017 <10.0  0.0 - 13.9 IU/mL Final  . Anit Nuclear Antibody(ANA) 02/25/2017 Negative  Negative Final  . Sed Rate 02/25/2017 11  0 - 40 mm/hr Final  . Lyme IgG/IgM Ab 02/25/2017 <0.91  0.00 - 0.90 ISR Final   Comment:                                 Negative         <0.91                                 Equivocal  0.91 - 1.09                                 Positive         >1.09   . Myeloperoxidase Ab 02/25/2017 <9.0  0.0 - 9.0 U/mL Final  . ANCA Proteinase 3 02/25/2017 <3.5  0.0 - 3.5 U/mL Final  . C-ANCA 02/25/2017 <1:20  Neg:<1:20 titer Final  . P-ANCA 02/25/2017 <1:20  Neg:<1:20 titer Final   Comment: The presence of positive fluorescence exhibiting P-ANCA or C-ANCA patterns alone is not specific for the diagnosis of Wegener's Granulomatosis (WG) or microscopic polyangiitis. Decisions about treatment should not be based solely on ANCA IFA results.  The International ANCA Group Consensus recommends follow up testing of positive sera with both PR-3 and MPO-ANCA enzyme immunoassays. As many as 5% serum samples are positive only by EIA. Ref. AM J Clin Pathol 1999;111:507-513.   Marland Kitchen Atypical pANCA 02/25/2017 <1:20  Neg:<1:20 titer Final   Comment: The atypical pANCA pattern has been observed in a significant percentage of patients with ulcerative colitis, primary sclerosing cholangitis and autoimmune hepatitis.     Imaging: Dg Chest 2 View  Result Date: 03/17/2017 CLINICAL DATA:  Immunosuppressive therapy EXAM: CHEST  2 VIEW COMPARISON:  None. FINDINGS: There is no focal parenchymal opacity. There is no pleural effusion or pneumothorax. The heart and mediastinal contours are unremarkable. The osseous structures are unremarkable. IMPRESSION: No  active cardiopulmonary disease. Electronically Signed   By: Kathreen Devoid   On: 03/17/2017 14:34   [place] Extrem Up Bilat Comp  Result Date: 03/31/2017 Ultrasound examination of bilateral hands was  performed per EULAR recommendations. Using 12 MHz transducer, grayscale and power Doppler bilateral second, third, and fifth MCP joints and bilateral wrist joints both dorsal and volar aspects were evaluated to look for synovitis or tenosynovitis. The findings were there was no synovitis or tenosynovitis on ultrasound examination. Right median nerve was 0.10 cm squares which was within normal limits and left median nerve was 0.10 cm squares which was within normal limits Impression: Ultrasound examination did not show any synovitis or tenosynovitis on examination. Her median nerves are within normal limits.  Xr Hand 2 View Left  Result Date: 03/17/2017 Mild PIP/DIP narrowing was noted. No MCP or intercarpal joint space narrowing was noted. No erosive changes were noted. 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 Mild before acromioclavicular joint narrowing was noted. No chondrocalcinosis was noted.  Xr Shoulder Right  Result Date: 03/17/2017 No glenohumeral joint space narrowing was noted. Mild before acromioclavicular joint narrowing was noted. No chondrocalcinosis was noted.   Speciality Comments: No specialty comments available.    Procedures:  No procedures performed Allergies: Patient has no known allergies.   Assessment / Plan:     Visit Diagnoses: Pain in both hands - All autoimmune workup negative - Plan: [place] Extrem Up Bilat Comp. Ultrasound examination today was unremarkable for any synovitis or tenosynovitis. Now she is also complaining about arthralgias in her shoulders and elbows.  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 My concern is that she may have a  scleroderma although she does not have Raynaud's phenomenon. She appears to have tight skin up to her forearms somewhat in her lower extremities and on her face. She has [place] oral aperture. At this point I would wait for the skin biopsy results. I will contact her as soon as we get the skin biopsy results.  Arthralgia of right hand: She complains of discomfort in her hands to the point she is taking Dilaudid at night. I discouraged the use of narcotics as this is going to be a long-term issue. Encouraged her to use over-the-counter sleep medication instead of Dilaudid.  Carpal tunnel syndrome on right: She had mild carpal tunnel syndrome on one of the studies done by Dr. Jannifer Franklin in the past. Her median nerves are within normal limits on the ultrasound.  Chronic pain of both shoulders  Other fatigue   She also has Chronic myelocytic leukemia ([place]): According to patient she's been in remission for a long time and came off [place] in October 2017. She believes her symptoms is started after stopping Gleevec,  Acquired hypothyroidism  Thyroid nodule   Orders: Orders Placed This Encounter  Procedures  . [place] Extrem Up Bilat Comp   No orders of the defined types were placed in this encounter.   Face-to-face time spent with patient was 40 minutes. 50% of time was spent in counseling and coordination of care.  Follow-Up Instructions: Return in about 4 weeks (around 04/28/2017) for Pain hands.   Bo Merino, MD  Note - This record has been created using Editor, commissioning.  Chart creation errors have been sought, but may not always  have been located. Such creation errors do not reflect on  the standard of medical care.

## 2017-03-21 LAB — 14-3-3 ETA PROTEIN

## 2017-03-22 NOTE — Progress Notes (Signed)
Please reschedule ultrasound of bilateral hands have not scheduled thank you

## 2017-03-22 NOTE — Progress Notes (Signed)
All labs are negative. Patient needs a skin biopsy from her hand to rule out scleroderma

## 2017-03-26 ENCOUNTER — Other Ambulatory Visit: Payer: Self-pay | Admitting: Internal Medicine

## 2017-03-26 ENCOUNTER — Telehealth: Payer: Self-pay | Admitting: *Deleted

## 2017-03-26 DIAGNOSIS — M79641 Pain in right hand: Secondary | ICD-10-CM

## 2017-03-26 DIAGNOSIS — E042 Nontoxic multinodular goiter: Secondary | ICD-10-CM

## 2017-03-26 NOTE — Telephone Encounter (Signed)
-----   Message from Bo Merino, MD sent at 03/22/2017  7:52 PM EDT ----- All labs are negative. Patient needs a skin biopsy from her hand to rule out scleroderma

## 2017-03-31 ENCOUNTER — Encounter: Payer: Self-pay | Admitting: Rheumatology

## 2017-03-31 ENCOUNTER — Inpatient Hospital Stay (INDEPENDENT_AMBULATORY_CARE_PROVIDER_SITE_OTHER): Payer: Self-pay

## 2017-03-31 ENCOUNTER — Ambulatory Visit (INDEPENDENT_AMBULATORY_CARE_PROVIDER_SITE_OTHER): Payer: BC Managed Care – PPO | Admitting: Rheumatology

## 2017-03-31 VITALS — BP 126/72 | HR 68 | Resp 16 | Wt 136.0 lb

## 2017-03-31 DIAGNOSIS — M25511 Pain in right shoulder: Secondary | ICD-10-CM

## 2017-03-31 DIAGNOSIS — M25512 Pain in left shoulder: Secondary | ICD-10-CM

## 2017-03-31 DIAGNOSIS — G8929 Other chronic pain: Secondary | ICD-10-CM | POA: Diagnosis not present

## 2017-03-31 DIAGNOSIS — R5383 Other fatigue: Secondary | ICD-10-CM

## 2017-03-31 DIAGNOSIS — M79641 Pain in right hand: Secondary | ICD-10-CM | POA: Diagnosis not present

## 2017-03-31 DIAGNOSIS — M25541 Pain in joints of right hand: Secondary | ICD-10-CM | POA: Diagnosis not present

## 2017-03-31 DIAGNOSIS — M79642 Pain in left hand: Secondary | ICD-10-CM

## 2017-03-31 DIAGNOSIS — G5601 Carpal tunnel syndrome, right upper limb: Secondary | ICD-10-CM

## 2017-04-28 NOTE — Progress Notes (Signed)
Office Visit Note  Patient: Heather Solis             Date of Birth: 04-14-1951           MRN: [id number]             PCP: Leonard Downing, MD Referring: Leonard Downing, * Visit Date: 05/01/2017 Occupation: @GUAROCC @    Subjective:  Right hand pain   History of Present Illness: Heather Solis is a 66 y.o. female with history of pain and discomfort in her bilateral hands. She states she's been taking Aleve 2 tablets twice a day which has helped her symptoms. If she misses a dose she feels more stiffness in her hands. She also went for a follow-up visit with her oncologist who felt that the symptoms of increased pain could be related to coming off [place]. She denies any Raynaud's phenomenon.  Activities of Daily Living:  Patient reports morning stiffness for 1 hour.   Patient Reports nocturnal pain.  Difficulty dressing/grooming: Denies Difficulty climbing stairs: Reports Difficulty getting out of chair: Reports Difficulty using hands for taps, buttons, cutlery, and/or writing: Reports   Review of Systems  Constitutional: Negative for fatigue, night sweats, weight gain, weight loss and weakness.  HENT: Negative for mouth sores, mouth dryness and nose dryness.   Eyes: Negative for pain, redness, itching, visual disturbance and dryness.  Respiratory: Negative for hemoptysis, apnea and difficulty breathing.   Cardiovascular: Negative for chest pain, palpitations, hypertension, irregular heartbeat and swelling in legs/feet.  Gastrointestinal: Negative for blood in stool, constipation and diarrhea.  Endocrine: Negative for increased urination.  Genitourinary: Negative for painful urination.  Musculoskeletal: Positive for arthralgias, joint pain, joint swelling, myalgias, morning stiffness and myalgias.  Skin: Positive for redness and skin tightness. Negative for color change, rash, nodules/bumps, ulcers and sensitivity to sunlight.  Allergic/Immunologic: Negative for  susceptible to infections.  Neurological: Negative for dizziness, headaches and night sweats.  Hematological: Negative for swollen glands.  Psychiatric/Behavioral: Negative for depressed mood and sleep disturbance. The patient is not nervous/anxious.     PMFS History:  Patient Active Problem List   Diagnosis Date Noted  . Pain in both hands 03/17/2017  . Chronic myelocytic leukemia ([place]) 03/17/2017  . Acquired hypothyroidism 03/17/2017  . Thyroid nodule 03/17/2017  . Arthralgia 02/25/2017  . Carpal tunnel syndrome on right 01/29/2017    Past Medical History:  Diagnosis Date  . Arthralgia 02/25/2017  . Carpal tunnel syndrome on right 01/29/2017  . Hypothyroid   . Leukemia ([place])    CML  . Thyroid cyst     Family History  Problem Relation Age of Onset  . Lung cancer Father    Past Surgical History:  Procedure Laterality Date  . NO PAST SURGERIES     Social History   Social History Narrative   Lives with husband   Caffeine use: 2 cups coffee/morning   Diet pepsi during day   Tea ocass   Right-handed     Objective: Vital Signs: BP 104/61   Pulse 73   Resp 14   Ht 5' 3"  (1.6 m)   Wt 136 lb (61.7 kg)   BMI 24.09 kg/m    Physical Exam  Constitutional: She is oriented to person, place, and time. She appears well-developed and well-nourished.  HENT:  Head: Normocephalic and atraumatic.  Eyes: Conjunctivae and EOM are normal.  Neck: Normal range of motion.  Cardiovascular: Normal rate, regular rhythm, normal heart sounds and intact distal pulses.  Pulmonary/Chest: Effort normal and breath sounds normal.  Abdominal: Soft. Bowel sounds are normal.  Lymphadenopathy:    She has no cervical adenopathy.  Neurological: She is alert and oriented to person, place, and time.  Skin: Skin is warm and dry. Capillary refill takes less than 2 seconds.  No is sclerodactyly was noted today. Edema in her right hand is reduced.  Psychiatric: She has a normal mood and affect. Her  behavior is normal.  Nursing note and vitals reviewed.    Musculoskeletal Exam: C-spine and thoracic lumbar spine good range of motion. Shoulder joints elbow joints wrist joints are good range of motion. She has thickening of bilateral PIP/DIP joints consistent with osteoarthritis. Hip joints knee joints ankles MTPs PIPs with good range of motion.  CDAI Exam: No CDAI exam completed.    Investigation: Findings:  03/17/2017 CBC normal, CMP calcium 10.6, UA normal, CK normal, ENA negative, C3-C4 normal, IgM low at 30, SPEP normal, CCP negative, 14 33 eta negative 02/25/2017 ESR 11, ANA negative, RF negative, ace negative, pan ANCA negative  Had appointment with Laurel Dimmer on 03/31/17. Dr Ronnald Ramp the skin biopsy report from 03/31/2017 showed minimal edema in deep reticular dermis only. There was no evidence of a scleroderma.    Imaging: No results found.  Speciality Comments: No specialty comments available.    Procedures:  No procedures performed Allergies: Patient has no known allergies.   Assessment / Plan:     Visit Diagnoses: Pain in both hands: Her clinical findings are consistent with osteoarthritis. She has decreased her fist formation due to severe osteoarthritis. The edema on her right hand is completely resolved. I also reviewed the skin biopsy report with her which showed only edema. Overall her symptoms have improved and she's been taking Aleve for pain management. I've advised her in case she takes long-term Aleve she will need monitoring of her LFTs and creatinine. GI toxicity from NSAIDs was also discussed. Given her a list of some natural anti-inflammatories to try as well. And muscle strengthening exercises were demonstrated and handout was given. Labs were reviewed which were unremarkable. Joint protection and muscle strengthening was also discussed.  Arthralgia of right hand: Improved  History of chronic monocytic leukemia: Off Gleevec now  History of  hypothyroidism    Orders: No orders of the defined types were placed in this encounter.  No orders of the defined types were placed in this encounter.   Face-to-face time spent with patient was 30 minutes. 50% of time was spent in counseling and coordination of care.  Follow-Up Instructions: Return if symptoms worsen or fail to improve, for Osteoarthritis.   Bo Merino, MD  Note - This record has been created using Editor, commissioning.  Chart creation errors have been sought, but may not always  have been located. Such creation errors do not reflect on  the standard of medical care.

## 2017-05-01 ENCOUNTER — Ambulatory Visit (INDEPENDENT_AMBULATORY_CARE_PROVIDER_SITE_OTHER): Payer: BC Managed Care – PPO | Admitting: Rheumatology

## 2017-05-01 ENCOUNTER — Encounter: Payer: Self-pay | Admitting: Rheumatology

## 2017-05-01 VITALS — BP 104/61 | HR 73 | Resp 14 | Ht 63.0 in | Wt 136.0 lb

## 2017-05-01 DIAGNOSIS — Z856 Personal history of leukemia: Secondary | ICD-10-CM | POA: Diagnosis not present

## 2017-05-01 DIAGNOSIS — M79641 Pain in right hand: Secondary | ICD-10-CM

## 2017-05-01 DIAGNOSIS — M19041 Primary osteoarthritis, right hand: Secondary | ICD-10-CM | POA: Diagnosis not present

## 2017-05-01 DIAGNOSIS — M25541 Pain in joints of right hand: Secondary | ICD-10-CM | POA: Diagnosis not present

## 2017-05-01 DIAGNOSIS — M79642 Pain in left hand: Secondary | ICD-10-CM

## 2017-05-01 DIAGNOSIS — M19042 Primary osteoarthritis, left hand: Secondary | ICD-10-CM | POA: Diagnosis not present

## 2017-05-01 DIAGNOSIS — Z8639 Personal history of other endocrine, nutritional and metabolic disease: Secondary | ICD-10-CM

## 2017-05-01 NOTE — Patient Instructions (Signed)
Supplements for OA Natural anti-inflammatories  You can purchase these at [employer], [employer] or online.  . Turmeric (capsules)  . Ginger (ginger root or capsules)  . Omega 3 (Fish, flax seeds, chia seeds, walnuts, almonds)  . Tart cherry (dried or extract)   Patient should be under the care of a physician while taking these supplements. This may not be reproduced without the permission of Dr. Bo Merino.  Hand Exercises Hand exercises can be helpful to almost anyone. These exercises can strengthen the hands, improve flexibility and movement, and increase blood flow to the hands. These results can make work and daily tasks easier. Hand exercises can be especially helpful for people who have joint pain from arthritis or have nerve damage from overuse (carpal tunnel syndrome). These exercises can also help people who have injured a hand. Most of these hand exercises are fairly gentle stretching routines. You can do them often throughout the day. Still, it is a good idea to ask your health care provider which exercises would be best for you. Warming your hands before exercise may help to reduce stiffness. You can do this with gentle massage or by placing your hands in warm water for 15 minutes. Also, make sure you pay attention to your level of hand pain as you begin an exercise routine. Exercises Knuckle Bend  Repeat this exercise 5-10 times with each hand. 1. Stand or sit with your arm, hand, and all five fingers pointed straight up. Make sure your wrist is straight. 2. Gently and slowly bend your fingers down and inward until the tips of your fingers are touching the tops of your palm. 3. Hold this position for a few seconds. 4. Extend your fingers out to their original position, all pointing straight up again. Finger Fan  Repeat this exercise 5-10 times with each hand. 1. Hold your arm and hand out in front of you. Keep your wrist straight. 2. Squeeze your hand into a  fist. 3. Hold this position for a few seconds. 4. Edison Simon out, or spread apart, your hand and fingers as much as possible, stretching every joint fully. Tabletop  Repeat this exercise 5-10 times with each hand. 1. Stand or sit with your arm, hand, and all five fingers pointed straight up. Make sure your wrist is straight. 2. Gently and slowly bend your fingers at the knuckles where they meet the hand until your hand is making an upside-down L shape. Your fingers should form a tabletop. 3. Hold this position for a few seconds. 4. Extend your fingers out to their original position, all pointing straight up again. Making Os  Repeat this exercise 5-10 times with each hand. 1. Stand or sit with your arm, hand, and all five fingers pointed straight up. Make sure your wrist is straight. 2. Make an O shape by touching your pointer finger to your thumb. Hold for a few seconds. Then open your hand wide. 3. Repeat this motion with each finger on your hand. Table Spread  Repeat this exercise 5-10 times with each hand. 1. Place your hand on a table with your palm facing down. Make sure your wrist is straight. 2. Spread your fingers out as much as possible. Hold this position for a few seconds. 3. Slide your fingers back together again. Hold for a few seconds. Ball Grip   Repeat this exercise 10-15 times with each hand. 1. Hold a tennis ball or another soft ball in your hand. 2. While slowly increasing pressure, squeeze the  ball as hard as possible. 3. Squeeze as hard as you can for 3-5 seconds. 4. Relax and repeat. Wrist Curls  Repeat this exercise 10-15 times with each hand. 1. Sit in a chair that has armrests. 2. Hold a light weight in your hand, such as a dumbbell that weighs 1-3 pounds (0.5-1.4 kg). Ask your health care provider what weight would be best for you. 3. Rest your hand just over the end of the chair arm with your palm facing up. 4. Gently pivot your wrist up and down while holding the  weight. Do not twist your wrist from side to side. Contact a health care provider if:  Your hand pain or discomfort gets much worse when you do an exercise.  Your hand pain or discomfort does not improve within 2 hours after you exercise. If you have any of these problems, stop doing these exercises right away. Do not do them again unless your health care provider says that you can. Get help right away if:  You develop sudden, severe hand pain. If this happens, stop doing these exercises right away. Do not do them again unless your health care provider says that you can. This information is not intended to replace advice given to you by your health care provider. Make sure you discuss any questions you have with your health care provider. Document Released: 11/20/2015 Document Revised: 05/16/2016 Document Reviewed: 06/19/2015 Elsevier Interactive Patient Education  2017 [employer].

## 2017-10-09 ENCOUNTER — Other Ambulatory Visit: Payer: BC Managed Care – PPO

## 2017-11-11 ENCOUNTER — Other Ambulatory Visit: Payer: Self-pay | Admitting: Family Medicine

## 2017-11-11 DIAGNOSIS — M8580 Other specified disorders of bone density and structure, unspecified site: Secondary | ICD-10-CM

## 2017-11-11 DIAGNOSIS — M5431 Sciatica, right side: Secondary | ICD-10-CM

## 2017-11-11 DIAGNOSIS — C9210 Chronic myeloid leukemia, BCR/ABL-positive, not having achieved remission: Secondary | ICD-10-CM

## 2017-11-21 ENCOUNTER — Other Ambulatory Visit: Payer: BC Managed Care – PPO

## 2017-11-24 ENCOUNTER — Ambulatory Visit
Admission: RE | Admit: 2017-11-24 | Discharge: 2017-11-24 | Disposition: A | Discharge status: Home / Self Care | Payer: BC Managed Care – PPO | Source: Ambulatory Visit | Attending: Family Medicine | Admitting: Family Medicine

## 2017-11-24 ENCOUNTER — Other Ambulatory Visit: Payer: Self-pay | Admitting: Family Medicine

## 2017-11-24 DIAGNOSIS — R52 Pain, unspecified: Secondary | ICD-10-CM

## 2017-11-28 ENCOUNTER — Other Ambulatory Visit: Payer: Self-pay | Admitting: Family Medicine

## 2017-11-28 DIAGNOSIS — M5418 Radiculopathy, sacral and sacrococcygeal region: Secondary | ICD-10-CM

## 2017-12-04 ENCOUNTER — Ambulatory Visit
Admission: RE | Admit: 2017-12-04 | Discharge: 2017-12-04 | Disposition: A | Discharge status: Home / Self Care | Payer: BC Managed Care – PPO | Source: Ambulatory Visit | Attending: Family Medicine | Admitting: Family Medicine

## 2017-12-04 DIAGNOSIS — M5418 Radiculopathy, sacral and sacrococcygeal region: Secondary | ICD-10-CM

## 2017-12-04 MED ORDER — IOPAMIDOL (ISOVUE-300) INJECTION 61%
100.0000 mL | Freq: Once | INTRAVENOUS | Status: AC | PRN
  Administered 2017-12-04: 100 mL via INTRAVENOUS

## 2017-12-09 ENCOUNTER — Other Ambulatory Visit: Payer: Self-pay | Admitting: Family Medicine

## 2017-12-09 DIAGNOSIS — E2839 Other primary ovarian failure: Secondary | ICD-10-CM

## 2017-12-24 ENCOUNTER — Ambulatory Visit
Admission: RE | Admit: 2017-12-24 | Discharge: 2017-12-24 | Disposition: A | Discharge status: Home / Self Care | Payer: BC Managed Care – PPO | Source: Ambulatory Visit | Attending: Family Medicine | Admitting: Family Medicine

## 2017-12-24 DIAGNOSIS — E2839 Other primary ovarian failure: Secondary | ICD-10-CM

## 2017-12-31 ENCOUNTER — Other Ambulatory Visit: Payer: Self-pay | Admitting: Family Medicine

## 2017-12-31 DIAGNOSIS — M810 Age-related osteoporosis without current pathological fracture: Secondary | ICD-10-CM

## 2018-01-05 ENCOUNTER — Ambulatory Visit
Admission: RE | Admit: 2018-01-05 | Discharge: 2018-01-05 | Disposition: A | Discharge status: Home / Self Care | Payer: BC Managed Care – PPO | Source: Ambulatory Visit | Attending: Family Medicine | Admitting: Family Medicine

## 2018-01-05 DIAGNOSIS — M810 Age-related osteoporosis without current pathological fracture: Secondary | ICD-10-CM

## 2018-02-19 ENCOUNTER — Ambulatory Visit
Admission: RE | Admit: 2018-02-19 | Discharge: 2018-02-19 | Disposition: A | Discharge status: Home / Self Care | Payer: BC Managed Care – PPO | Source: Ambulatory Visit | Attending: Internal Medicine | Admitting: Internal Medicine

## 2018-02-19 DIAGNOSIS — E042 Nontoxic multinodular goiter: Secondary | ICD-10-CM

## 2019-04-04 IMAGING — CT CT ABD-PELV W/ CM
3 of 5 series · 12 of 36 positions shown, 18 images · IV contrast (READICAT/WATER & [ID] ISOVUE 300)
Comparison: Sacral radiographs 11/24/2017.

ADDENDUM:
Study discussed by telephone with Dr. KIRI DELVA on 12/05/2017 at
1851 hours.

As seen on the 11/24/2017 radiographs there posterior left hip
region retained foreign body - a linear metallic object in the
subcutaneous fat posterior to the left greater trochanter, about 2
cm in length. No surrounding inflammation. This appears chronic and
is probably inconsequential.
CLINICAL DATA: 66-year-old female with persistent sacral, buttocks
pain since [REDACTED] following lifting injury. Radicular pain of
sacrum.
EXAM:
CT ABDOMEN AND PELVIS WITH CONTRAST
TECHNIQUE: Multidetector CT imaging of the abdomen and pelvis was performed
using the standard protocol following bolus administration of
intravenous contrast.
CONTRAST:  100mL OCUT1Y-EUU IOPAMIDOL (OCUT1Y-EUU) INJECTION 61%

[Series 3: abd/pelvis with · axial · 0.78mm/px · z∈[-336,-16]mm · 7 of 86 slices shown, 12 images]
[im 11/86  soft-tissue]
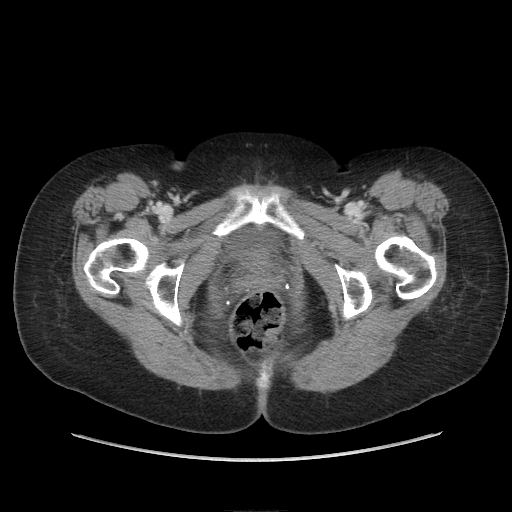
[im 11/86  bone]
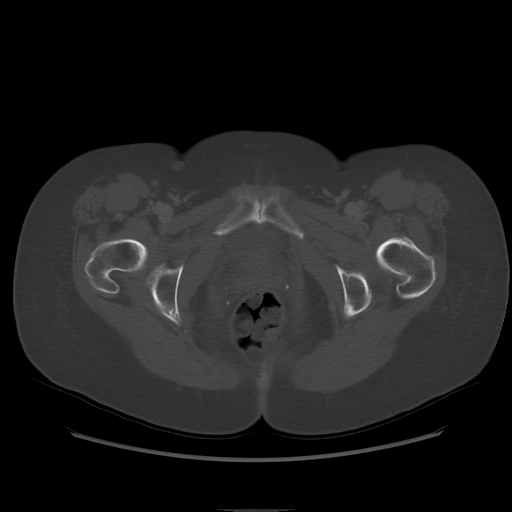
[im 22/86  soft-tissue]
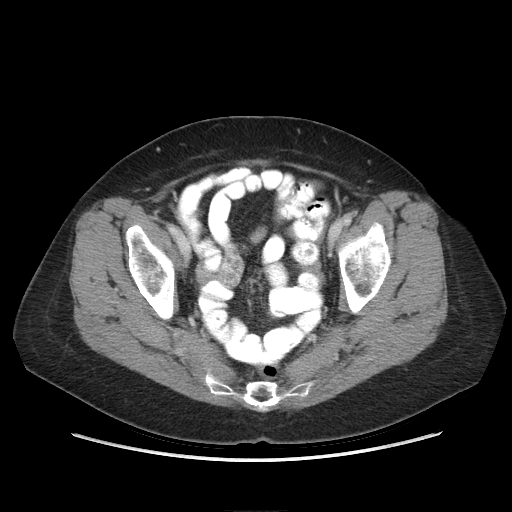
[im 32/86  soft-tissue]
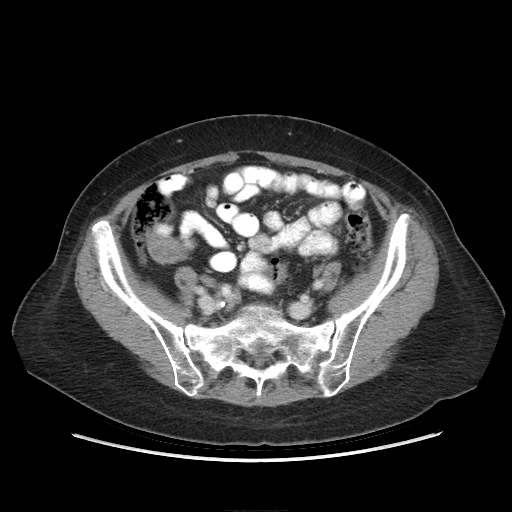
[im 43/86  soft-tissue]
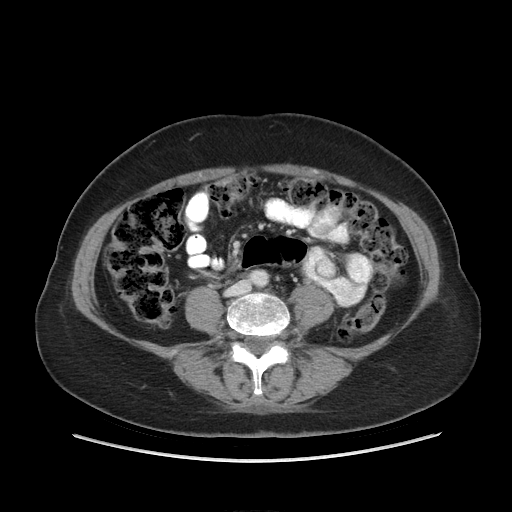
[im 43/86  lung]
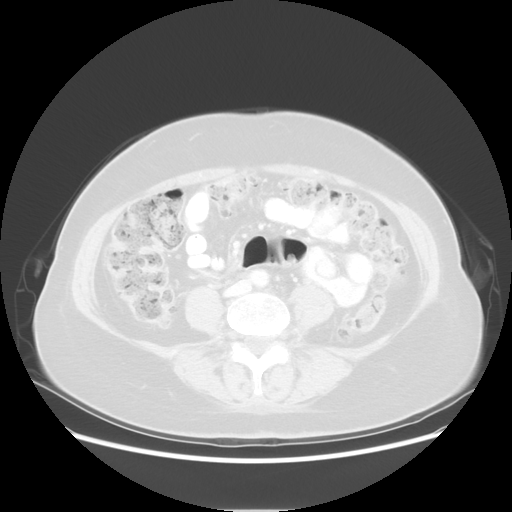
[im 54/86  soft-tissue]
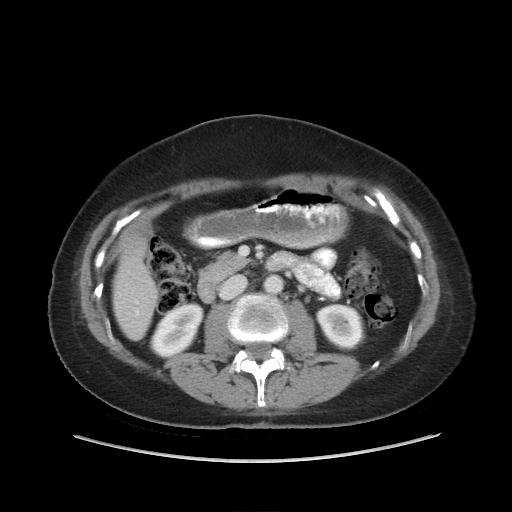
[im 54/86  lung]
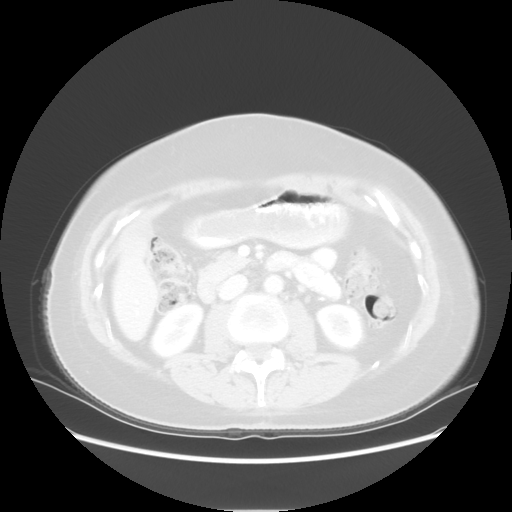
[im 64/86  soft-tissue]
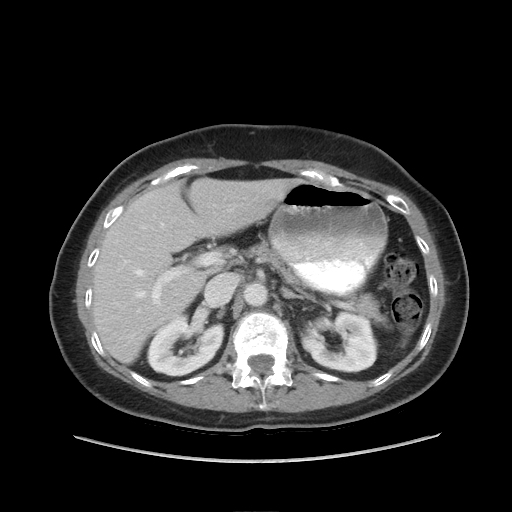
[im 64/86  lung]
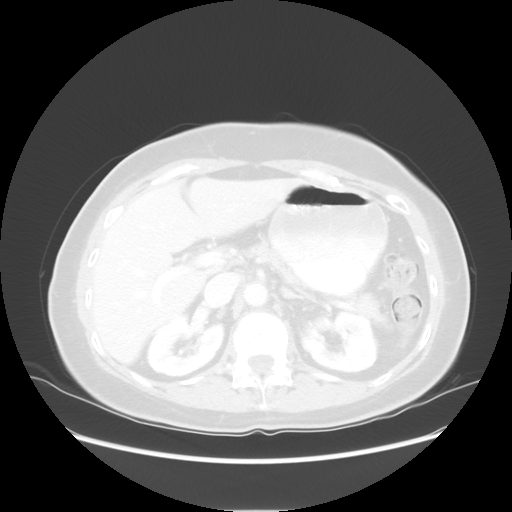
[im 75/86  soft-tissue]
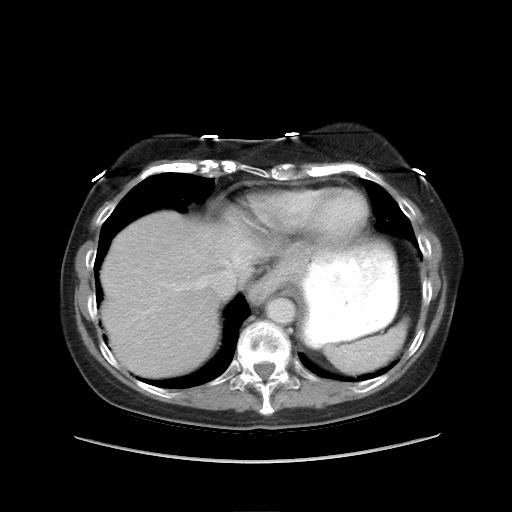
[im 75/86  lung]
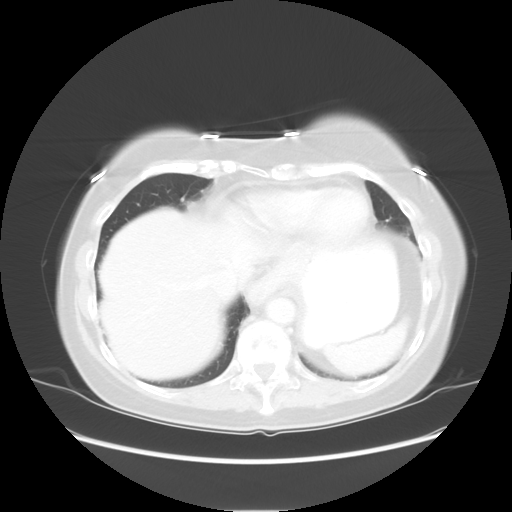

[Series 601: coronal body · coronal · 0.91mm/px · 1 of 115 slices shown, 2 images]
[im 39/115  soft-tissue]
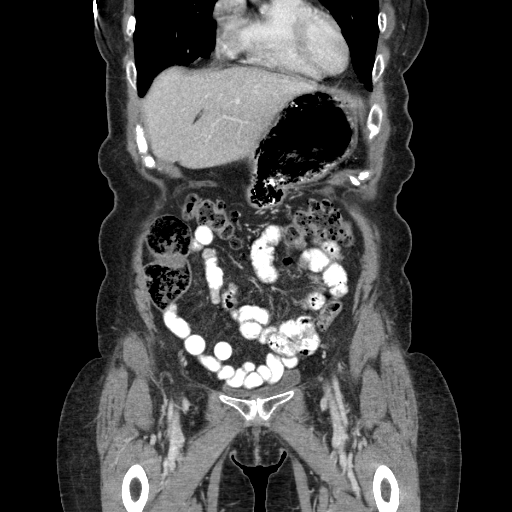
[im 39/115  bone]
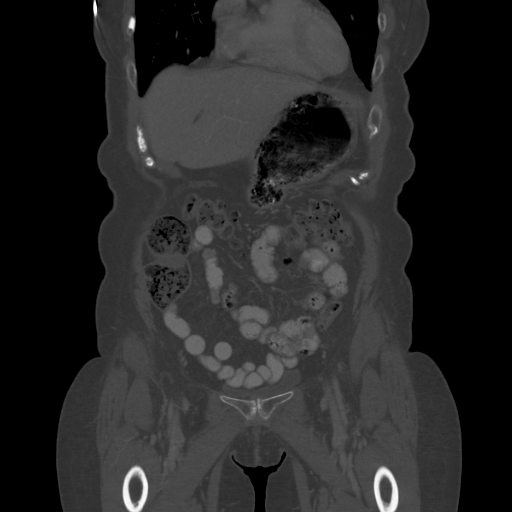

[Series 602: sagittal body · sagittal · 0.91mm/px · 4 of 157 slices shown]
[im 10/157  soft-tissue]
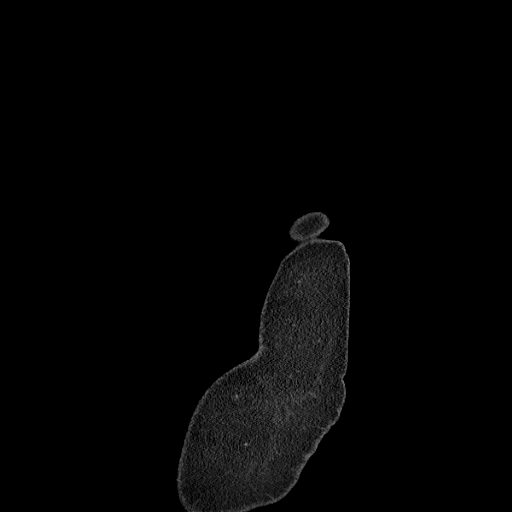
[im 30/157  soft-tissue]
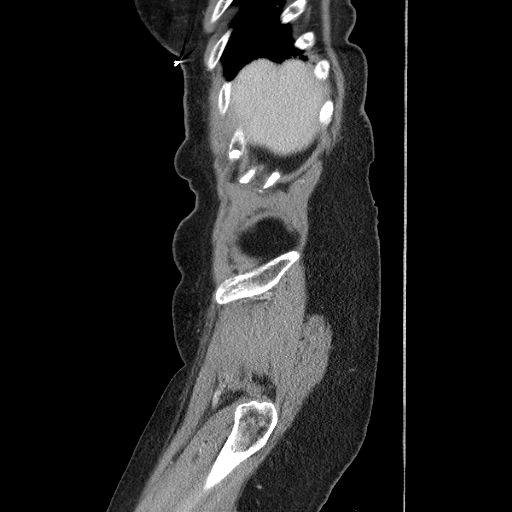
[im 49/157  soft-tissue]
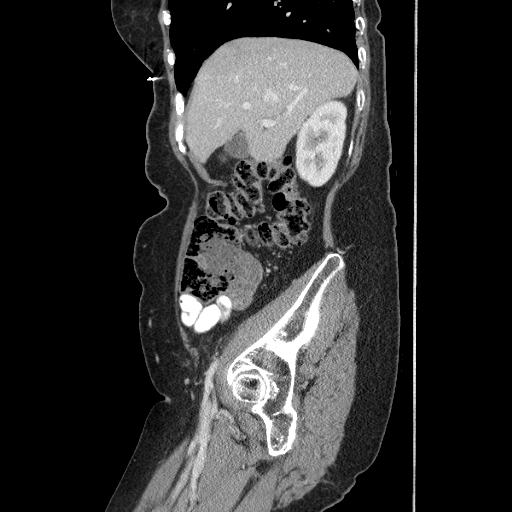
[im 69/157  soft-tissue]
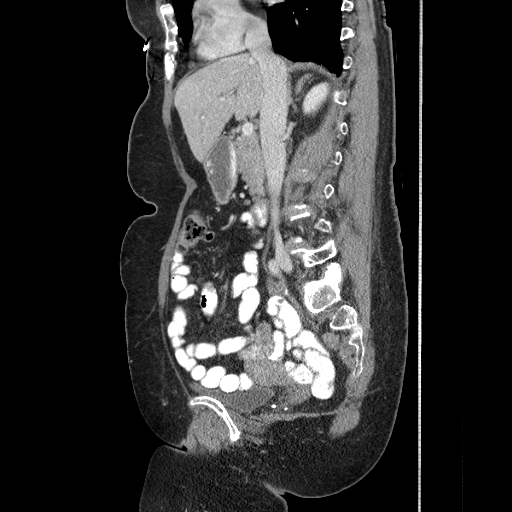

[12 of 36 positions shown; findings below may reference images not displayed]

FINDINGS: Lower chest: Negative lung bases. No pericardial or pleural
effusion. Small gastric hiatal hernia.

Hepatobiliary: Negative liver and gallbladder.

Pancreas: Negative.

Spleen: Negative.

Adrenals/Urinary Tract: Normal adrenal glands.

Bilateral renal enhancement and contrast excretion is symmetric and
normal.

Unremarkable urinary bladder. Numerous small pelvic phleboliths
incidentally noted.

Stomach/Bowel: Mild retained stool in the rectum. Mostly
decompressed sigmoid colon.

Moderate diverticulosis in the proximal sigmoid and distal
descending colon. No active inflammation identified.

Redundant splenic flexure and transverse colon. Moderate
diverticulosis at both the splenic and hepatic flexures. No active
inflammation. Negative ascending colon and retrocecal appendix. Oral
contrast has not yet reached the the distal small bowel, but the
terminal ileum appears normal. No dilated or inflamed small bowel
loops. Small gastric hiatal hernia. Otherwise negative stomach and
duodenum.

No abdominal free fluid.

Vascular/Lymphatic: Mild Calcified aortic atherosclerosis. Major
arterial structures in the abdomen and pelvis are patent. Portal
venous system is patent. New line no lymphadenopathy.

Reproductive: Negative.

Other: No pelvic free fluid.

Musculoskeletal: There are compression fractures throughout the
lower thoracic and lumbar spine. From T10 inferiorly, only the T10,
T12, and L5 vertebrae are intact. Moderate compression fracture of
T11, with more mild superior endplate compression elsewhere. These
are age indeterminate. There is also heterogeneous sclerosis of both
sacral ala compatible with age indeterminate sacral insufficiency
fractures (series 3, image 58 on the left).

The pelvis otherwise appears intact. Visible lower ribs appear
intact.
IMPRESSION: 1. Multilevel lower thoracic and lumbar spinal compression
fractures, and bilateral sacral insufficiency fractures. These are
age indeterminate.
Noncontrast Lumbar MRI plus sacrum MRI, or alternatively Nuclear
Medicine Whole-body Bone Scan would best determine acuity.
2. No acute or inflammatory process in the abdomen or pelvis.
Multifocal diverticulosis of the large bowel without active
inflammation.
3. Mild aortic atherosclerosis.

## 2020-02-01 ENCOUNTER — Ambulatory Visit: Payer: Self-pay | Attending: Internal Medicine

## 2020-02-03 ENCOUNTER — Ambulatory Visit: Payer: Self-pay

## 2020-07-19 ENCOUNTER — Other Ambulatory Visit: Payer: Self-pay | Admitting: Family Medicine

## 2020-07-19 DIAGNOSIS — M810 Age-related osteoporosis without current pathological fracture: Secondary | ICD-10-CM

## 2020-07-21 ENCOUNTER — Other Ambulatory Visit: Payer: Self-pay

## 2020-07-21 ENCOUNTER — Ambulatory Visit
Admission: RE | Admit: 2020-07-21 | Discharge: 2020-07-21 | Disposition: A | Discharge status: Home / Self Care | Payer: Medicare PPO | Source: Ambulatory Visit | Attending: Family Medicine | Admitting: Family Medicine

## 2020-07-21 DIAGNOSIS — M810 Age-related osteoporosis without current pathological fracture: Secondary | ICD-10-CM

## 2023-08-19 NOTE — Telephone Encounter (Signed)
 Dr. Perri called her and let her know she does need annual mammograms and its scheduled

## 2023-08-19 NOTE — Telephone Encounter (Signed)
 I called patient to let her know mammogram is good. Plan to repeat in one year. She reports that she and her daughter scheduled  and moved back a few days to be > 1 year for insurance coverage (from 8/22 to 8/29).   Karie LITTIE Monte, MD

## 2024-06-24 ENCOUNTER — Other Ambulatory Visit: Payer: Self-pay

## 2024-06-24 ENCOUNTER — Inpatient Hospital Stay (HOSPITAL_BASED_OUTPATIENT_CLINIC_OR_DEPARTMENT_OTHER)
Admission: EM | Admit: 2024-06-24 | Discharge: 2024-06-27 | DRG: 398 | Disposition: A | Discharge status: Home / Self Care | Attending: General Surgery | Admitting: General Surgery

## 2024-06-24 ENCOUNTER — Emergency Department (HOSPITAL_BASED_OUTPATIENT_CLINIC_OR_DEPARTMENT_OTHER)

## 2024-06-24 ENCOUNTER — Encounter (HOSPITAL_BASED_OUTPATIENT_CLINIC_OR_DEPARTMENT_OTHER): Payer: Self-pay | Admitting: Emergency Medicine

## 2024-06-24 DIAGNOSIS — K3521 Acute appendicitis with generalized peritonitis, without perforation, with abscess: Secondary | ICD-10-CM | POA: Diagnosis not present

## 2024-06-24 DIAGNOSIS — Z801 Family history of malignant neoplasm of trachea, bronchus and lung: Secondary | ICD-10-CM

## 2024-06-24 DIAGNOSIS — C9210 Chronic myeloid leukemia, BCR/ABL-positive, not having achieved remission: Secondary | ICD-10-CM | POA: Diagnosis present

## 2024-06-24 DIAGNOSIS — K381 Appendicular concretions: Secondary | ICD-10-CM | POA: Diagnosis present

## 2024-06-24 DIAGNOSIS — K3533 Acute appendicitis with perforation and localized peritonitis, with abscess: Secondary | ICD-10-CM | POA: Diagnosis not present

## 2024-06-24 DIAGNOSIS — R339 Retention of urine, unspecified: Secondary | ICD-10-CM | POA: Diagnosis not present

## 2024-06-24 DIAGNOSIS — Z79890 Hormone replacement therapy (postmenopausal): Secondary | ICD-10-CM

## 2024-06-24 DIAGNOSIS — E039 Hypothyroidism, unspecified: Secondary | ICD-10-CM | POA: Diagnosis present

## 2024-06-24 DIAGNOSIS — K3532 Acute appendicitis with perforation and localized peritonitis, without abscess: Secondary | ICD-10-CM | POA: Diagnosis present

## 2024-06-24 LAB — COMPREHENSIVE METABOLIC PANEL WITH GFR
ALT: 17 U/L (ref 0–44)
AST: 25 U/L (ref 15–41)
Albumin: 4.4 g/dL (ref 3.5–5.0)
Alkaline Phosphatase: 65 U/L (ref 38–126)
Anion gap: 12 (ref 5–15)
BUN: 12 mg/dL (ref 8–23)
CO2: 26 mmol/L (ref 22–32)
Calcium: 9.7 mg/dL (ref 8.9–10.3)
Chloride: 99 mmol/L (ref 98–111)
Creatinine, Ser: 0.68 mg/dL (ref 0.44–1.00)
GFR, Estimated: >60 mL/min (ref >60)
Glucose, Bld: 104 mg/dL — ABNORMAL HIGH (ref 70–99)
Potassium: 4.6 mmol/L (ref 3.5–5.1)
Sodium: 137 mmol/L (ref 135–145)
Total Bilirubin: 1.1 mg/dL (ref 0.0–1.2)
Total Protein: 8 g/dL (ref 6.5–8.1)

## 2024-06-24 LAB — URINALYSIS, ROUTINE W REFLEX MICROSCOPIC
Bilirubin Urine: NEGATIVE
Glucose, UA: NEGATIVE mg/dL
Ketones, ur: NEGATIVE mg/dL
Nitrite: NEGATIVE
Protein, ur: NEGATIVE mg/dL
Specific Gravity, Urine: 1.015 (ref 1.005–1.030)
pH: 6.5 (ref 5.0–8.0)

## 2024-06-24 LAB — CBC
HCT: 42.4 % (ref 36.0–46.0)
Hemoglobin: 14.2 g/dL (ref 12.0–15.0)
MCH: 31.6 pg (ref 26.0–34.0)
MCHC: 33.5 g/dL (ref 30.0–36.0)
MCV: 94.2 fL (ref 80.0–100.0)
Platelets: 306 10*3/uL (ref 150–400)
RBC: 4.5 MIL/uL (ref 3.87–5.11)
RDW: 12.6 % (ref 11.5–15.5)
WBC: 14.4 10*3/uL — ABNORMAL HIGH (ref 4.0–10.5)
nRBC: 0 % (ref 0.0–0.2)

## 2024-06-24 LAB — LIPASE, BLOOD: Lipase: 17 U/L (ref 11–51)

## 2024-06-24 LAB — URINALYSIS, MICROSCOPIC (REFLEX)

## 2024-06-24 MED ORDER — SALINE SPRAY 0.65 % NA SOLN: 1.0000 | Freq: Four times a day (QID) | NASAL | Status: DC | PRN

## 2024-06-24 MED ORDER — IOHEXOL 300 MG/ML  SOLN
100.0000 mL | Freq: Once | INTRAMUSCULAR | Status: AC | PRN
  Administered 2024-06-24: 100 mL via INTRAVENOUS

## 2024-06-24 MED ORDER — MAGIC MOUTHWASH: 15.0000 mL | Freq: Four times a day (QID) | ORAL | Status: DC | PRN

## 2024-06-24 MED ORDER — CHLORHEXIDINE GLUCONATE CLOTH 2 % EX PADS
6.0000 | MEDICATED_PAD | Freq: Once | CUTANEOUS | Status: AC
  Administered 2024-06-25: 6 via TOPICAL

## 2024-06-24 MED ORDER — ALUM & MAG HYDROXIDE-SIMETH 200-200-20 MG/5ML PO SUSP: 30.0000 mL | Freq: Four times a day (QID) | ORAL | Status: DC | PRN

## 2024-06-24 MED ORDER — HYDROMORPHONE HCL 1 MG/ML IJ SOLN: 0.5000 mg | INTRAMUSCULAR | Status: DC | PRN

## 2024-06-24 MED ORDER — ACETAMINOPHEN 650 MG RE SUPP: 650.0000 mg | Freq: Four times a day (QID) | RECTAL | Status: DC | PRN

## 2024-06-24 MED ORDER — ACETAMINOPHEN 500 MG PO TABS
1000.0000 mg | ORAL_TABLET | ORAL | Status: AC
Start: 2024-06-25 — End: 2024-06-25
  Administered 2024-06-25: 1000 mg via ORAL
  Filled 2024-06-24: qty 2

## 2024-06-24 MED ORDER — DIPHENHYDRAMINE HCL 50 MG/ML IJ SOLN: 12.5000 mg | Freq: Four times a day (QID) | INTRAMUSCULAR | Status: DC | PRN

## 2024-06-24 MED ORDER — LACTATED RINGERS IV BOLUS: 1000.0000 mL | Freq: Three times a day (TID) | INTRAVENOUS | Status: AC | PRN

## 2024-06-24 MED ORDER — OXYCODONE HCL 5 MG/5ML PO SOLN
5.0000 mg | ORAL | Status: DC | PRN
  Administered 2024-06-26: 5 mg via ORAL
  Filled 2024-06-24: qty 5

## 2024-06-24 MED ORDER — SODIUM CHLORIDE 0.9 % IV SOLN
2.0000 g | INTRAVENOUS | Status: AC
  Administered 2024-06-25: 2 g via INTRAVENOUS
  Filled 2024-06-24: qty 2

## 2024-06-24 MED ORDER — PROCHLORPERAZINE EDISYLATE 10 MG/2ML IJ SOLN: 5.0000 mg | INTRAMUSCULAR | Status: DC | PRN

## 2024-06-24 MED ORDER — ACETAMINOPHEN 325 MG PO TABS
325.0000 mg | ORAL_TABLET | Freq: Four times a day (QID) | ORAL | Status: DC | PRN
  Administered 2024-06-25 – 2024-06-26 (×2): 650 mg via ORAL
  Filled 2024-06-24: qty 2

## 2024-06-24 MED ORDER — METHOCARBAMOL 1000 MG/10ML IJ SOLN: 1000.0000 mg | Freq: Four times a day (QID) | INTRAMUSCULAR | Status: DC | PRN

## 2024-06-24 MED ORDER — MENTHOL 3 MG MT LOZG: 1.0000 | LOZENGE | OROMUCOSAL | Status: DC | PRN

## 2024-06-24 MED ORDER — SIMETHICONE 40 MG/0.6ML PO SUSP: 80.0000 mg | Freq: Four times a day (QID) | ORAL | Status: DC | PRN

## 2024-06-24 MED ORDER — SODIUM CHLORIDE 0.9 % IV SOLN: Freq: Once | INTRAVENOUS | Status: DC

## 2024-06-24 MED ORDER — GABAPENTIN 300 MG PO CAPS
300.0000 mg | ORAL_CAPSULE | ORAL | Status: AC
  Administered 2024-06-25: 300 mg via ORAL

## 2024-06-24 MED ORDER — LACTATED RINGERS IV BOLUS
1000.0000 mL | Freq: Once | INTRAVENOUS | Status: AC
  Administered 2024-06-25: 1000 mL via INTRAVENOUS

## 2024-06-24 MED ORDER — ONDANSETRON HCL 4 MG/2ML IJ SOLN
4.0000 mg | Freq: Four times a day (QID) | INTRAMUSCULAR | Status: DC | PRN
  Administered 2024-06-25: 4 mg via INTRAVENOUS

## 2024-06-24 MED ORDER — NAPHAZOLINE-GLYCERIN 0.012-0.25 % OP SOLN: 1.0000 [drp] | Freq: Four times a day (QID) | OPHTHALMIC | Status: DC | PRN

## 2024-06-24 MED ORDER — METHOCARBAMOL 500 MG PO TABS
1000.0000 mg | ORAL_TABLET | Freq: Four times a day (QID) | ORAL | Status: DC | PRN
  Administered 2024-06-26: 1000 mg via ORAL
  Filled 2024-06-24: qty 2

## 2024-06-24 MED ORDER — PSYLLIUM 95 % PO PACK
1.0000 | PACK | Freq: Two times a day (BID) | ORAL | Status: DC
  Administered 2024-06-26: 1 via ORAL
  Filled 2024-06-24: qty 1

## 2024-06-24 MED ORDER — PIPERACILLIN-TAZOBACTAM 3.375 G IVPB
3.3750 g | Freq: Three times a day (TID) | INTRAVENOUS | Status: DC
  Administered 2024-06-24 – 2024-06-25 (×2): 3.375 g via INTRAVENOUS
  Filled 2024-06-24 (×2): qty 50

## 2024-06-24 MED ORDER — METOPROLOL TARTRATE 5 MG/5ML IV SOLN: 5.0000 mg | Freq: Four times a day (QID) | INTRAVENOUS | Status: DC | PRN

## 2024-06-24 MED ORDER — PHENOL 1.4 % MT LIQD: 2.0000 | OROMUCOSAL | Status: DC | PRN

## 2024-06-24 NOTE — Consult Note (Signed)
 Shila Kruczek Brandhorst  [birth year] [id number]  CARE TEAM:  PCP: Loring Tanda Mae, MD  Outpatient Care Team: Patient Care Team: Loring Tanda Mae, MD as PCP - General (Family Medicine) Alix Charleston, MD as Consulting Physician (Neurosurgery) Perri Karie CROME, MD as Referring Physician (Hematology and Oncology) Dolphus Reiter, MD as Consulting Physician (Rheumatology)  Inpatient Treatment Team: Treatment Team:  Zackowski, Scott, MD Beverley Leita DELENA DEVONNA Carlin Stefano JONETTA, RN Ccs, Md, MD   This patient is a 73 y.o.female who presents today for surgical evaluation at the request of Leita Beverley, PA at [facility]  Chief complaint / Reason for evaluation: Abdominal pain, probable appendicitis  73 year old woman.  History of CML stable.  Hypothyroidism.  Noted abdominal pain starting 48 hours.  Worsening and more intense.  Came to [facility] ER.  History physical CAT scan suspicious for appendicitis.  Probable 2 x 1 cm abscess at tip of appendix with fecalith.  Surgical consultation requested   Assessment  Heather Solis  73 y.o. female     Procedure(s): APPENDECTOMY, LAPAROSCOPIC  Problem List:  Principal Problem:   Acute perforated appendicitis Active Problems:   Chronic myelocytic leukemia (HCC)   Acquired hypothyroidism   Probable perforated appendicitis with Wagler abscess  Plan:  Bowel rest  IV antibiotics.  Would favor Zosyn given probable perforated complex state.  IV fluid resuscitation.  Nausea pain control.  Plan to transfer from [facility] to [facility] in the morning for probable diagnostic laparoscopy and appendectomy.  Evaluation once patient at hospital where surgeon can examine    I reviewed ED provider notes, last 24 h vitals and pain scores, last 48 h intake and output, last 24 h labs and trends, and last 24 h imaging results. I have reviewed this patient's available data, including medical history, events of  note, test results, etc as part of my evaluation.  A significant portion of that time was spent in counseling.  Care during the described time interval was provided by me.  This care required moderate level of medical decision making.  06/24/2024  Elspeth KYM Schultze, MD, FACS, MASCRS Esophageal, Gastrointestinal & Colorectal Surgery Robotic and Minimally Invasive Surgery  [facility] [address], [address] [place], [place] [address] [phone] Fax [phone] Main  CONTACT INFORMATION: Weekday (9AM-5PM): Call [facility] main office at [phone] Weeknight (5PM-9AM) or Weekend/Holiday: Check EPIC Web Links tab & use AMION (password  [email or web address]) for General Surgery [facility] coverage  Please, DO NOT use SecureChat  (it is not reliable communication to reach operating surgeons & will lead to a delay in care).   Epic staff messaging available for outptient concerns needing 1-2 business day response.      06/24/2024      Past Medical History:  Diagnosis Date   Arthralgia 02/25/2017   Carpal tunnel syndrome on right 01/29/2017   Hypothyroid    Leukemia (HCC)    CML   Thyroid  cyst     Past Surgical History:  Procedure Laterality Date   NO PAST SURGERIES      Social History   Socioeconomic History   Marital status: Married    Spouse name: Not on file   Number of children: 3   Years of education: 12   Highest education level: Not on file  Occupational History   Occupation: Retired  Tobacco Use   Smoking status: Never   Smokeless tobacco: Never  Vaping  Use   Vaping status: Never Used  Substance and Sexual Activity   Alcohol use: No   Drug use: No   Sexual activity: Not on file  Other Topics Concern   Not on file  Social History Narrative   Lives with husband   Caffeine use: 2 cups coffee/morning   Diet pepsi during day   Tea ocass   Right-handed   Social Drivers of Corporate investment banker Strain: Not on file   Food Insecurity: Not on file  Transportation Needs: Not on file  Physical Activity: Not on file  Stress: Not on file  Social Connections: Not on file  Intimate Partner Violence: Not on file    Family History  Problem Relation Age of Onset   Lung cancer Father     Current Facility-Administered Medications  Medication Dose Route Frequency Provider Last Rate Last Admin   acetaminophen (TYLENOL) suppository 650 mg  650 mg Rectal Q6H PRN Sheldon Standing, MD       [START ON 06/25/2024] acetaminophen (TYLENOL) tablet 1,000 mg  1,000 mg Oral On Call to OR Sheldon Standing, MD       acetaminophen (TYLENOL) tablet 325-650 mg  325-650 mg Oral Q6H PRN Sheldon Standing, MD       alum & mag hydroxide-simeth (MAALOX/MYLANTA) 200-200-20 MG/5ML suspension 30 mL  30 mL Oral Q6H PRN Sheldon Standing, MD       [START ON 06/25/2024] cefoTEtan (CEFOTAN) 2 g in sodium chloride 0.9 % 100 mL IVPB  2 g Intravenous On Call to OR Sheldon Standing, MD       Chlorhexidine Gluconate Cloth 2 % PADS 6 each  6 each Topical Once Sheldon Standing, MD       And   Chlorhexidine Gluconate Cloth 2 % PADS 6 each  6 each Topical Once Sheldon Standing, MD       diphenhydrAMINE (BENADRYL) injection 12.5-25 mg  12.5-25 mg Intravenous Q6H PRN Sheldon Standing, MD       NOREEN ON 06/25/2024] gabapentin (NEURONTIN) capsule 300 mg  300 mg Oral On Call to OR Sheldon Standing, MD       HYDROmorphone  (DILAUDID ) injection 0.5-2 mg  0.5-2 mg Intravenous Q2H PRN Jlyn Bracamonte, Standing, MD       lactated ringers bolus 1,000 mL  1,000 mL Intravenous Once Sheldon Standing, MD       lactated ringers bolus 1,000 mL  1,000 mL Intravenous Q8H PRN Sheldon Standing, MD       magic mouthwash  15 mL Oral QID PRN Sheldon Standing, MD       menthol-cetylpyridinium (CEPACOL) lozenge 3 mg  1 lozenge Oral PRN Sheldon Standing, MD       methocarbamol (ROBAXIN) injection 1,000 mg  1,000 mg Intravenous Q6H PRN Sheldon Standing, MD       methocarbamol (ROBAXIN) tablet 1,000 mg  1,000 mg Oral Q6H PRN Sheldon Standing, MD       metoprolol tartrate (LOPRESSOR) injection 5 mg  5 mg Intravenous Q6H PRN Sheldon Standing, MD       naphazoline-glycerin (CLEAR EYES REDNESS) ophth solution 1-2 drop  1-2 drop Both Eyes QID PRN Sheldon Standing, MD       ondansetron (ZOFRAN) injection 4 mg  4 mg Intravenous Q6H PRN Sheldon Standing, MD       oxyCODONE (ROXICODONE) 5 MG/5ML solution 5-10 mg  5-10 mg Oral Q4H PRN Sheldon Standing, MD       phenol (CHLORASEPTIC) mouth spray 2 spray  2 spray Mouth/Throat  PRN Sheldon Standing, MD       piperacillin-tazobactam (ZOSYN) IVPB 3.375 g  3.375 g Intravenous Q8H Reome, Earle J, RPH 12.5 mL/hr at 06/24/24 2130 3.375 g at 06/24/24 2130   prochlorperazine (COMPAZINE) injection 5-10 mg  5-10 mg Intravenous Q4H PRN Sheldon Standing, MD       psyllium (HYDROCIL/METAMUCIL) 1 packet  1 packet Oral BID Sheldon Standing, MD       simethicone (MYLICON) 40 MG/0.6ML suspension 80 mg  80 mg Oral QID PRN Sheldon Standing, MD       sodium chloride (OCEAN) 0.65 % nasal spray 1-2 spray  1-2 spray Each Nare Q6H PRN Sheldon Standing, MD       Current Outpatient Medications  Medication Sig Dispense Refill   calcium carbonate (CALCIUM 600) 600 MG TABS tablet Take 1 tablet by mouth daily.     cetirizine (ZYRTEC) 10 MG tablet Take 10 mg by mouth daily.     HYDROmorphone  (DILAUDID ) 2 MG tablet Take 1 tablet (2 mg total) by mouth every 6 (six) hours as needed for severe pain. (Patient not taking: Reported on 05/01/2017) 50 tablet 0   levothyroxine (SYNTHROID, LEVOTHROID) 112 MCG tablet      Naproxen Sodium (ALEVE PO) Take by mouth.       No Known Allergies   BP 133/67   Pulse 84   Temp 98.2 F (36.8 C) (Oral)   Resp 18   Ht 5' 3 (1.6 m)   Wt 58.5 kg   SpO2 97%   BMI 22.85 kg/m     Results:   Labs: Results for orders placed or performed during the hospital encounter of 06/24/24 (from the past 48 hours)  Urinalysis, Routine w reflex microscopic -Urine, Clean Catch     Status: Abnormal   Collection Time:  06/24/24  3:40 PM  Result Value Ref Range   Color, Urine YELLOW YELLOW   APPearance CLEAR CLEAR   Specific Gravity, Urine 1.015 1.005 - 1.030   pH 6.5 5.0 - 8.0   Glucose, UA NEGATIVE NEGATIVE mg/dL   Hgb urine dipstick Grundman (A) NEGATIVE   Bilirubin Urine NEGATIVE NEGATIVE   Ketones, ur NEGATIVE NEGATIVE mg/dL   Protein, ur NEGATIVE NEGATIVE mg/dL   Nitrite NEGATIVE NEGATIVE   Leukocytes,Ua Stammer (A) NEGATIVE    Comment: Performed at [facility], [address] [place], [place] [address]  Urinalysis, Microscopic (reflex)     Status: Abnormal   Collection Time: 06/24/24  3:40 PM  Result Value Ref Range   RBC / HPF 6-10 0 - 5 RBC/hpf   WBC, UA 6-10 0 - 5 WBC/hpf   Bacteria, UA RARE (A) NONE SEEN   Squamous Epithelial / HPF 0-5 0 - 5 /HPF    Comment: Performed at [facility], [address] [place], [place] [address]  Lipase, blood     Status: None   Collection Time: 06/24/24  3:42 PM  Result Value Ref Range   Lipase 17 11 - 51 U/L    Comment: Performed at [facility], [address] [place], [place] [address]  Comprehensive metabolic panel     Status: Abnormal   Collection Time: 06/24/24  3:42 PM  Result Value Ref Range   Sodium 137 135 - 145 mmol/L   Potassium 4.6 3.5 - 5.1 mmol/L    Comment: HEMOLYSIS AT THIS LEVEL MAY AFFECT RESULT   Chloride 99 98 - 111 mmol/L   CO2 26 22 -  32 mmol/L   Glucose, Bld 104 (H) 70 - 99 mg/dL    Comment: Glucose reference range applies only to samples taken after fasting for at least 8 hours.   BUN 12 8 - 23 mg/dL   Creatinine, Ser 9.31 0.44 - 1.00 mg/dL   Calcium 9.7 8.9 - 89.6 mg/dL   Total Protein 8.0 6.5 - 8.1 g/dL   Albumin 4.4 3.5 - 5.0 g/dL   AST 25 15 - 41 U/L   ALT 17 0 - 44 U/L   Alkaline Phosphatase 65 38 - 126 U/L   Total Bilirubin 1.1 0.0 - 1.2 mg/dL   GFR, Estimated >39 >39 mL/min    Comment: (NOTE) Calculated using the CKD-EPI Creatinine Equation (2021)    Anion gap 12 5 - 15     Comment: Performed at [facility], [address] [place], [place] [address]  CBC     Status: Abnormal   Collection Time: 06/24/24  3:42 PM  Result Value Ref Range   WBC 14.4 (H) 4.0 - 10.5 K/uL   RBC 4.50 3.87 - 5.11 MIL/uL   Hemoglobin 14.2 12.0 - 15.0 g/dL   HCT 57.5 63.9 - 53.9 %   MCV 94.2 80.0 - 100.0 fL   MCH 31.6 26.0 - 34.0 pg   MCHC 33.5 30.0 - 36.0 g/dL   RDW 87.3 88.4 - 84.4 %   Platelets 306 150 - 400 K/uL   nRBC 0.0 0.0 - 0.2 %    Comment: Performed at [facility], [address] [place], [place] [address]    Imaging / Studies: CT ABDOMEN PELVIS W CONTRAST Result Date: 06/24/2024 CLINICAL DATA:  Right lower quadrant pain.  Nausea. EXAM: CT ABDOMEN AND PELVIS WITH CONTRAST TECHNIQUE: Multidetector CT imaging of the abdomen and pelvis was performed using the standard protocol following bolus administration of intravenous contrast. RADIATION DOSE REDUCTION: This exam was performed according to the departmental dose-optimization program which includes automated exposure control, adjustment of the mA and/or kV according to patient size and/or use of iterative reconstruction technique. CONTRAST:  OMNIPAQUE IOHEXOL 300 MG/ML  SOLN COMPARISON:  CT 12/04/2017 FINDINGS: Lower chest: Porcher to moderate hiatal hernia. Mild subpleural ground-glass may be related to scarring. No focal airspace disease. No pleural effusion. Hepatobiliary: No suspicious liver lesion. Gallbladder physiologically distended, no calcified stone. No biliary dilatation. Pancreas: No ductal dilatation or inflammation. Spleen: Normal in size without focal abnormality. Adrenals/Urinary Tract: Normal adrenal glands. No hydronephrosis or perinephric edema. Homogeneous renal enhancement with symmetric excretion on delayed phase imaging. No renal calculi or suspicious renal abnormality. Urinary bladder is physiologically distended without wall thickening. Stomach/Bowel: The appendix is dilated at 14  mm with wall thickening and prominent periappendiceal fat stranding. At the appendiceal tip is a 2.4 x 1.9 cm fluid collection, series 2, image 52 containing an appendicolith suspicious for abscess. There is cecal wall thickening at the appendiceal insertion. Moderate colonic stool burden with diverticulosis throughout the colon. No discrete diverticulitis. There is no Guay bowel obstruction, inflammation or ileus. Mach to moderate-sized hiatal hernia. Vascular/Lymphatic: Aortic atherosclerosis. No aneurysm. Portal vein is patent. Reproductive: Prominent bilateral periuterine and adnexal vascularity with dilatation of the ovarian veins 7 mm on the left and 7 mm on the right. Prominent lymph nodes in the ileocolic chain are likely reactive, coronal series 8, images 54 through 57. Other: Stranding in the right lower quadrant related to inflamed appendix with trace free fluid. There is no free  intra-abdominal air. Musculoskeletal: Compression fractures at T11, L1, L2, and L4, without significant interval change. Compression fracture of T10 is new from 2018 but appears chronic. Prominent Schmorl's node within superior endplate of L3. IMPRESSION: 1. Acute appendicitis. 2.4 x 1.9 cm fluid collection at the appendiceal tip containing an appendicolith suspicious for abscess. 2. Prominent lymph nodes in the ileocolic chain are likely reactive. 3. Colonic diverticulosis without diverticulitis. 4. Malesky to moderate-sized hiatal hernia. 5. Prominent bilateral periuterine and adnexal vascularity with dilatation of the ovarian veins, can be seen with pelvic congestion syndrome in the appropriate clinical setting. Aortic Atherosclerosis (ICD10-I70.0). Electronically Signed   By: Andrea Gasman M.D.   On: 06/24/2024 20:52    Medications / Allergies: per chart  Antibiotics: Anti-infectives (From admission, onward)    Start     Dose/Rate Route Frequency Ordered Stop   06/25/24 0600  cefoTEtan (CEFOTAN) 2 g in sodium  chloride 0.9 % 100 mL IVPB        2 g 200 mL/hr over 30 Minutes Intravenous On call to O.R. 06/24/24 2141 06/26/24 0559   06/24/24 2200  piperacillin-tazobactam (ZOSYN) IVPB 3.375 g        3.375 g 12.5 mL/hr over 240 Minutes Intravenous Every 8 hours 06/24/24 2123           Note: Portions of this report may have been transcribed using voice recognition software. Every effort was made to ensure accuracy; however, inadvertent computerized transcription errors may be present.   Any transcriptional errors that result from this process are unintentional.    Elspeth KYM Schultze, MD, FACS, MASCRS Esophageal, Gastrointestinal & Colorectal Surgery Robotic and Minimally Invasive Surgery  [facility] [address] [place], [place] [address] [phone] Fax [phone] Main  CONTACT INFORMATION: Weekday (9AM-5PM): Call [facility] main office at [phone] Weeknight (5PM-9AM) or Weekend/Holiday: Check EPIC Web Links tab & use AMION (password  [email or web address]) for General Surgery [facility] coverage  Please, DO NOT use SecureChat  (it is not reliable communication to reach operating surgeons & will lead to a delay in care).   Epic staff messaging available for outptient concerns needing 1-2 business day response.       06/24/2024  9:43 PM

## 2024-06-24 NOTE — ED Provider Notes (Signed)
 73 year old female with complaint of abdominal pain onset Tuesday.  Pending CT WBC elevated at 14.4 Hx CML, treated No prior abdominal surgeries.  Physical Exam  BP 133/67   Pulse 84   Temp 98.2 F (36.8 C) (Oral)   Resp 18   Ht 5' 3 (1.6 m)   Wt 58.5 kg   SpO2 97%   BMI 22.85 kg/m   Physical Exam  Procedures  Procedures  ED Course / MDM    Medical Decision Making Amount and/or Complexity of Data Reviewed Labs: ordered. Radiology: ordered.  Risk Prescription drug management. Decision regarding hospitalization.   Appendicitis with abscess on CT. Discussed with Dr. Sheldon with general surgery. Recommends Zosyn . Transfer to short stay in the morning for surgery.        Beverley Leita LABOR, PA-C 06/24/24 2137    Geraldene Hamilton, MD 06/25/24 1807

## 2024-06-24 NOTE — Progress Notes (Signed)
 Pharmacy Antibiotic Note  Heather Solis is a 73 y.o. female admitted on 06/24/2024 with IAI.  Pharmacy has been consulted for zosyn dosing.  Plan: Zosyn 3.375g IV q8h (4 hour infusion).  Height: 5' 3 (160 cm) Weight: 58.5 kg (129 lb) IBW/kg (Calculated) : 52.4  Temp (24hrs), Avg:97.7 F (36.5 C), Min:97.7 F (36.5 C), Max:97.7 F (36.5 C)  Recent Labs  Lab 06/24/24 1542  WBC 14.4*  CREATININE 0.68    Estimated Creatinine Clearance: 53.4 mL/min (by C-G formula based on SCr of 0.68 mg/dL).    No Known Allergies    Thank you for allowing pharmacy to be a part of this patient's care.  Benedetta Heath BS, PharmD, BCPS Clinical Pharmacist 06/24/2024 9:23 PM  Contact: [phone] after 3 PM  Be curious, not judgmental... -Davina Sprinkles

## 2024-06-24 NOTE — ED Triage Notes (Signed)
 Pt POV c/o RLQ pain since Tuesday, sent by pcp.  Denies n/v/d. Reports pain has improved since initial onset, but reports still feeling overall unwell.  Reports nausea yesterday AM.  Moderate po intake.   Pt also reports decreased urinary frequency.

## 2024-06-24 NOTE — ED Notes (Signed)
 Pt has returned to their room

## 2024-06-24 NOTE — ED Provider Notes (Signed)
  EMERGENCY DEPARTMENT AT [facility] Provider Note   CSN: [id number] Arrival date & time: 06/24/24  1531     Patient presents with: Abdominal Pain   Heather Solis is a 73 y.o. female.   Patient with history of CML, in remission, no past surgical history --presents to the emergency department for evaluation of abdominal pain that started on 06/22/2024.  Patient reports pain is started in the mid abdomen and then moved more towards the right side.  Patient had nausea and decreased appetite.  Last significant spell of nausea was yesterday morning.  Currently the pain is more in the mid abdomen again.  No fevers, chest pain or shortness of breath.  No vomiting.  Patient has had several bowel movements without blood.  She denies urinary symptoms.  PCP recommended that she come to the emergency department for imaging today.       Prior to Admission medications   Medication Sig Start Date End Date Taking? Authorizing Provider  calcium  carbonate (CALCIUM  600) 600 MG TABS tablet Take 1 tablet by mouth daily.    [provider]  cetirizine (ZYRTEC) 10 MG tablet Take 10 mg by mouth daily.    [provider]  HYDROmorphone  (DILAUDID ) 2 MG tablet Take 1 tablet (2 mg total) by mouth every 6 (six) hours as needed for severe pain. Patient not taking: Reported on 05/01/2017 02/25/17   Jenel Carlin POUR, MD  levothyroxine  (SYNTHROID , LEVOTHROID) 112 MCG tablet  04/30/17   [provider]  Naproxen  Sodium (ALEVE  PO) Take by mouth.    [provider]    Allergies: Patient has no known allergies.    Review of Systems  Updated Vital Signs BP (!) 160/85 (BP Location: Left Arm)   Pulse 88   Temp 97.7 F (36.5 C) (Oral)   Resp 20   Ht 5' 3 (1.6 m)   Wt 58.5 kg   SpO2 99%   BMI 22.85 kg/m   Physical Exam Vitals and nursing note reviewed.  Constitutional:      General: She is not in acute distress.    Appearance: She is well-developed.   HENT:     Head: Normocephalic and atraumatic.     Right Ear: External ear normal.     Left Ear: External ear normal.     Nose: Nose normal.  Eyes:     Conjunctiva/sclera: Conjunctivae normal.  Cardiovascular:     Rate and Rhythm: Normal rate and regular rhythm.     Heart sounds: No murmur heard. Pulmonary:     Effort: No respiratory distress.     Breath sounds: No wheezing, rhonchi or rales.  Abdominal:     Palpations: Abdomen is soft.     Tenderness: There is abdominal tenderness in the right lower quadrant, periumbilical area and suprapubic area. There is no guarding or rebound. Negative signs include Murphy's sign and McBurney's sign.     Comments: Patient winces with palpation  Musculoskeletal:     Cervical back: Normal range of motion and neck supple.     Right lower leg: No edema.     Left lower leg: No edema.  Skin:    General: Skin is warm and dry.     Findings: No rash.  Neurological:     General: No focal deficit present.     Mental Status: She is alert. Mental status is at baseline.     Motor: No weakness.  Psychiatric:  Mood and Affect: Mood normal.     (all labs ordered are listed, but only abnormal results are displayed) Labs Reviewed  COMPREHENSIVE METABOLIC PANEL WITH GFR - Abnormal; Notable for the following components:      Result Value   Glucose, Bld 104 (*)    All other components within normal limits  CBC - Abnormal; Notable for the following components:   WBC 14.4 (*)    All other components within normal limits  URINALYSIS, ROUTINE W REFLEX MICROSCOPIC - Abnormal; Notable for the following components:   Hgb urine dipstick Dace (*)    Leukocytes,Ua Jodoin (*)    All other components within normal limits  URINALYSIS, MICROSCOPIC (REFLEX) - Abnormal; Notable for the following components:   Bacteria, UA RARE (*)    All other components within normal limits  LIPASE, BLOOD    EKG: None  Radiology: No results found.   Procedures    Medications Ordered in the ED - No data to display   ED Course  Patient seen and examined. History obtained directly from patient. Work-up including labs, imaging, EKG ordered in triage, if performed, were reviewed.    Labs/EKG: Independently reviewed and interpreted.  This included: CBC with elevated white blood cell count of 14,400, normal hemoglobin; CMP unremarkable; lipase normal; UA not compelling for infection.  Imaging: Independently visualized and interpreted.  This included: CT abdomen pelvis  Medications/Fluids: Patient currently comfortable lying flat, declines medication.  Most recent vital signs reviewed and are as follows: BP (!) 160/85 (BP Location: Left Arm)   Pulse 88   Temp 97.7 F (36.5 C) (Oral)   Resp 20   Ht 5' 3 (1.6 m)   Wt 58.5 kg   SpO2 99%   BMI 22.85 kg/m   Initial impression: Mid and right lower quadrant abdominal pain, evaluate for possibility of appendicitis, diverticulitis.  7:00 PM Signout to [employer] at shift change. Pending results and dispo per results.                                   Medical Decision Making Amount and/or Complexity of Data Reviewed Labs: ordered. Radiology: ordered.   For this patient's complaint of abdominal pain, the following conditions were considered on the differential diagnosis: gastritis/PUD, enteritis/duodenitis, appendicitis, cholelithiasis/cholecystitis, cholangitis, pancreatitis, ruptured viscus, colitis, diverticulitis, Keelin/large bowel obstruction, proctitis, cystitis, pyelonephritis, ureteral colic, aortic dissection, aortic aneurysm. In women, ectopic pregnancy, pelvic inflammatory disease, ovarian cysts, and tubo-ovarian abscess were also considered. Atypical chest etiologies were also considered including ACS, PE, and pneumonia.      Final diagnoses:  None    ED Discharge Orders     None          Desiderio Chew, DEVONNA 06/24/24 1850    Geraldene Hamilton, MD 06/25/24 [phone]

## 2024-06-25 ENCOUNTER — Emergency Department (HOSPITAL_COMMUNITY): Admitting: Anesthesiology

## 2024-06-25 ENCOUNTER — Other Ambulatory Visit: Payer: Self-pay

## 2024-06-25 ENCOUNTER — Encounter (HOSPITAL_COMMUNITY): Admission: EM | Disposition: A | Discharge status: Home / Self Care | Payer: Self-pay | Source: Home / Self Care

## 2024-06-25 ENCOUNTER — Encounter (HOSPITAL_BASED_OUTPATIENT_CLINIC_OR_DEPARTMENT_OTHER): Payer: Self-pay | Admitting: Anesthesiology

## 2024-06-25 DIAGNOSIS — K3532 Acute appendicitis with perforation and localized peritonitis, without abscess: Secondary | ICD-10-CM | POA: Diagnosis present

## 2024-06-25 DIAGNOSIS — K3521 Acute appendicitis with generalized peritonitis, without perforation, with abscess: Secondary | ICD-10-CM | POA: Diagnosis present

## 2024-06-25 DIAGNOSIS — K3533 Acute appendicitis with perforation and localized peritonitis, with abscess: Secondary | ICD-10-CM | POA: Diagnosis not present

## 2024-06-25 DIAGNOSIS — E039 Hypothyroidism, unspecified: Secondary | ICD-10-CM

## 2024-06-25 DIAGNOSIS — K381 Appendicular concretions: Secondary | ICD-10-CM | POA: Diagnosis not present

## 2024-06-25 DIAGNOSIS — R339 Retention of urine, unspecified: Secondary | ICD-10-CM | POA: Diagnosis not present

## 2024-06-25 DIAGNOSIS — C9210 Chronic myeloid leukemia, BCR/ABL-positive, not having achieved remission: Secondary | ICD-10-CM | POA: Diagnosis not present

## 2024-06-25 DIAGNOSIS — Z801 Family history of malignant neoplasm of trachea, bronchus and lung: Secondary | ICD-10-CM | POA: Diagnosis not present

## 2024-06-25 DIAGNOSIS — Z79890 Hormone replacement therapy (postmenopausal): Secondary | ICD-10-CM | POA: Diagnosis not present

## 2024-06-25 HISTORY — PX: LAPAROSCOPIC APPENDECTOMY: SHX408

## 2024-06-25 SURGERY — APPENDECTOMY, LAPAROSCOPIC
Anesthesia: General | Site: Abdomen

## 2024-06-25 MED ORDER — DEXAMETHASONE SODIUM PHOSPHATE 10 MG/ML IJ SOLN
INTRAMUSCULAR | Status: AC
Start: 2024-06-25 — End: 2024-06-25
  Filled 2024-06-25: qty 1

## 2024-06-25 MED ORDER — BUPIVACAINE-EPINEPHRINE (PF) 0.25% -1:200000 IJ SOLN
INTRAMUSCULAR | Status: AC
  Filled 2024-06-25: qty 30

## 2024-06-25 MED ORDER — FENTANYL CITRATE (PF) 100 MCG/2ML IJ SOLN
INTRAMUSCULAR | Status: AC
Start: 2024-06-25 — End: 2024-06-25
  Filled 2024-06-25: qty 2

## 2024-06-25 MED ORDER — ORAL CARE MOUTH RINSE: 15.0000 mL | Freq: Once | OROMUCOSAL | Status: AC

## 2024-06-25 MED ORDER — ONDANSETRON HCL 4 MG/2ML IJ SOLN: 4.0000 mg | Freq: Once | INTRAMUSCULAR | Status: DC | PRN

## 2024-06-25 MED ORDER — DROPERIDOL 2.5 MG/ML IJ SOLN: 0.6250 mg | Freq: Once | INTRAMUSCULAR | Status: DC | PRN

## 2024-06-25 MED ORDER — LACTATED RINGERS IV SOLN: INTRAVENOUS | Status: DC

## 2024-06-25 MED ORDER — LIDOCAINE HCL (CARDIAC) PF 100 MG/5ML IV SOSY
PREFILLED_SYRINGE | INTRAVENOUS | Status: DC | PRN
  Administered 2024-06-25: 60 mg via INTRAVENOUS

## 2024-06-25 MED ORDER — ROCURONIUM BROMIDE 10 MG/ML (PF) SYRINGE
PREFILLED_SYRINGE | INTRAVENOUS | Status: AC
Start: 2024-06-25 — End: 2024-06-25
  Filled 2024-06-25: qty 10

## 2024-06-25 MED ORDER — GABAPENTIN 300 MG PO CAPS
ORAL_CAPSULE | ORAL | Status: AC
Start: 2024-06-25 — End: 2024-06-25
  Filled 2024-06-25: qty 1

## 2024-06-25 MED ORDER — PANTOPRAZOLE SODIUM 40 MG IV SOLR
40.0000 mg | Freq: Every day | INTRAVENOUS | Status: DC
  Administered 2024-06-25: 40 mg via INTRAVENOUS
  Filled 2024-06-25: qty 10

## 2024-06-25 MED ORDER — ONDANSETRON HCL 4 MG/2ML IJ SOLN
INTRAMUSCULAR | Status: AC
  Filled 2024-06-25: qty 2

## 2024-06-25 MED ORDER — DEXAMETHASONE SODIUM PHOSPHATE 10 MG/ML IJ SOLN
INTRAMUSCULAR | Status: DC | PRN
  Administered 2024-06-25: 6 mg via INTRAVENOUS

## 2024-06-25 MED ORDER — BUPIVACAINE-EPINEPHRINE 0.25% -1:200000 IJ SOLN
INTRAMUSCULAR | Status: DC | PRN
  Administered 2024-06-25: 20 mL

## 2024-06-25 MED ORDER — SODIUM CHLORIDE 0.9 % IV SOLN: INTRAVENOUS | Status: AC

## 2024-06-25 MED ORDER — LIDOCAINE HCL (PF) 2 % IJ SOLN
INTRAMUSCULAR | Status: AC
  Filled 2024-06-25: qty 5

## 2024-06-25 MED ORDER — HYDROMORPHONE HCL 1 MG/ML IJ SOLN
0.5000 mg | INTRAMUSCULAR | Status: DC | PRN
  Administered 2024-06-25: 0.5 mg via INTRAVENOUS
  Filled 2024-06-25: qty 1

## 2024-06-25 MED ORDER — SUGAMMADEX SODIUM 200 MG/2ML IV SOLN
INTRAVENOUS | Status: DC | PRN
Start: 2024-06-25 — End: 2024-06-25
  Administered 2024-06-25: 200 mg via INTRAVENOUS

## 2024-06-25 MED ORDER — HYDROMORPHONE HCL 1 MG/ML IJ SOLN: 0.2500 mg | INTRAMUSCULAR | Status: DC | PRN

## 2024-06-25 MED ORDER — CHLORHEXIDINE GLUCONATE 0.12 % MT SOLN
15.0000 mL | Freq: Once | OROMUCOSAL | Status: AC
  Administered 2024-06-25: 15 mL via OROMUCOSAL

## 2024-06-25 MED ORDER — MORPHINE SULFATE (PF) 2 MG/ML IV SOLN: 1.0000 mg | INTRAVENOUS | Status: DC | PRN

## 2024-06-25 MED ORDER — ROCURONIUM BROMIDE 100 MG/10ML IV SOLN
INTRAVENOUS | Status: DC | PRN
Start: 2024-06-25 — End: 2024-06-25
  Administered 2024-06-25: 60 mg via INTRAVENOUS

## 2024-06-25 MED ORDER — PROPOFOL 10 MG/ML IV BOLUS
INTRAVENOUS | Status: DC | PRN
  Administered 2024-06-25: 120 mg via INTRAVENOUS
  Administered 2024-06-25: 50 ug/kg/min via INTRAVENOUS

## 2024-06-25 MED ORDER — LACTATED RINGERS IR SOLN
Status: DC | PRN
  Administered 2024-06-25: 1000 mL

## 2024-06-25 MED ORDER — FENTANYL CITRATE (PF) 100 MCG/2ML IJ SOLN
INTRAMUSCULAR | Status: DC | PRN
  Administered 2024-06-25 (×2): 50 ug via INTRAVENOUS
  Administered 2024-06-25: 100 ug via INTRAVENOUS

## 2024-06-25 MED ORDER — PIPERACILLIN-TAZOBACTAM 3.375 G IVPB
3.3750 g | Freq: Three times a day (TID) | INTRAVENOUS | Status: DC
  Administered 2024-06-25 – 2024-06-27 (×5): 3.375 g via INTRAVENOUS
  Filled 2024-06-25 (×5): qty 50

## 2024-06-25 MED ORDER — ACETAMINOPHEN 500 MG PO TABS
ORAL_TABLET | ORAL | Status: AC
Start: 2024-06-25 — End: 2024-06-25
  Filled 2024-06-25: qty 2

## 2024-06-25 SURGICAL SUPPLY — 36 items
BAG COUNTER SPONGE SURGICOUNT (BAG) IMPLANT
CABLE HIGH FREQUENCY MONO STRZ (ELECTRODE) IMPLANT
CHLORAPREP W/TINT 26 (MISCELLANEOUS) ×2 IMPLANT
CLIP APPLIE 5 13 M/L LIGAMAX5 (MISCELLANEOUS) IMPLANT
CLIP APPLIE ROT 10 11.4 M/L (STAPLE) IMPLANT
COVER SURGICAL LIGHT HANDLE (MISCELLANEOUS) ×2 IMPLANT
CUTTER FLEX LINEAR 45M (STAPLE) IMPLANT
DERMABOND ADVANCED .7 DNX12 (GAUZE/BANDAGES/DRESSINGS) ×2 IMPLANT
ELECT PENCIL ROCKER SW 15FT (MISCELLANEOUS) IMPLANT
ELECT REM PT RETURN 15FT ADLT (MISCELLANEOUS) ×2 IMPLANT
ENDOLOOP SUT PDS II 0 18 (SUTURE) IMPLANT
GLOVE BIO SURGEON STRL SZ7.5 (GLOVE) ×2 IMPLANT
GOWN STRL REUS W/ TWL LRG LVL3 (GOWN DISPOSABLE) IMPLANT
IRRIGATION SUCT STRKRFLW 2 WTP (MISCELLANEOUS) ×2 IMPLANT
KIT BASIN OR (CUSTOM PROCEDURE TRAY) ×2 IMPLANT
KIT TURNOVER KIT A (KITS) ×2 IMPLANT
RELOAD 45 THICK GREEN (ENDOMECHANICALS) IMPLANT
RELOAD STAPLE 45 3.5 BLU ETS (ENDOMECHANICALS) IMPLANT
RELOAD STAPLE 45 GRN THCK ETS (ENDOMECHANICALS) IMPLANT
RELOAD STAPLE 60 BLK VRY/THCK (STAPLE) IMPLANT
RELOAD STAPLE TA45 3.5 REG BLU (ENDOMECHANICALS) IMPLANT
RELOAD STAPLER 60MM BLK (STAPLE) ×4 IMPLANT
SCISSORS LAP 5X35 DISP (ENDOMECHANICALS) IMPLANT
SET TUBE SMOKE EVAC HIGH FLOW (TUBING) ×2 IMPLANT
SHEARS HARMONIC 36 ACE (MISCELLANEOUS) ×2 IMPLANT
SLEEVE Z-THREAD 5X100MM (TROCAR) ×2 IMPLANT
SPIKE FLUID TRANSFER (MISCELLANEOUS) ×2 IMPLANT
STAPLE ECHEON FLEX 60 POW ENDO (STAPLE) IMPLANT
SUT MNCRL AB 4-0 PS2 18 (SUTURE) ×2 IMPLANT
SYSTEM BAG RETRIEVAL 10MM (BASKET) ×2 IMPLANT
TOWEL OR 17X26 10 PK STRL BLUE (TOWEL DISPOSABLE) ×2 IMPLANT
TRAY FOLEY MTR SLVR 14FR STAT (SET/KITS/TRAYS/PACK) IMPLANT
TRAY FOLEY MTR SLVR 16FR STAT (SET/KITS/TRAYS/PACK) IMPLANT
TRAY LAPAROSCOPIC (CUSTOM PROCEDURE TRAY) ×2 IMPLANT
TROCAR BALLN 12MMX100 BLUNT (TROCAR) ×2 IMPLANT
TROCAR Z-THREAD OPTICAL 5X100M (TROCAR) ×2 IMPLANT

## 2024-06-25 NOTE — ED Notes (Addendum)
 Teeth brushed. Remains NPO, family at bedside .

## 2024-06-25 NOTE — Anesthesia Preprocedure Evaluation (Addendum)
 Anesthesia Evaluation  Patient identified by MRN, date of birth, ID band Patient awake    Reviewed: Allergy & Precautions, NPO status , Patient's Chart, lab work & pertinent test results  Airway Mallampati: II  TM Distance: >3 FB Neck ROM: Full    Dental  (+) Chipped, Dental Advisory Given,  Overbite Had recent alveolar surgery:   Pulmonary neg pulmonary ROS   Pulmonary exam normal breath sounds clear to auscultation       Cardiovascular negative cardio ROS Normal cardiovascular exam Rhythm:Regular Rate:Normal     Neuro/Psych  Neuromuscular disease  negative psych ROS   GI/Hepatic Neg liver ROS,,,Acute appendicitis with perforation/abscess   Endo/Other  Hypothyroidism    Renal/GU negative Renal ROS  negative genitourinary   Musculoskeletal negative musculoskeletal ROS (+)    Abdominal  (+)  Abdomen: tender.   Peds  Hematology CML S/P ChemoRx - in remission   Anesthesia Other Findings   Reproductive/Obstetrics                              Anesthesia Physical Anesthesia Plan  ASA: 3 and emergent  Anesthesia Plan: General   Post-op Pain Management: Dilaudid  IV, Precedex and Ofirmev  IV (intra-op)*   Induction: Intravenous  PONV Risk Score and Plan: 4 or greater and Treatment may vary due to age or medical condition and Ondansetron   Airway Management Planned: Oral ETT and Video Laryngoscope Planned  Additional Equipment: None  Intra-op Plan:   Post-operative Plan: Extubation in OR  Informed Consent: I have reviewed the patients History and Physical, chart, labs and discussed the procedure including the risks, benefits and alternatives for the proposed anesthesia with the patient or authorized representative who has indicated his/her understanding and acceptance.     Dental advisory given  Plan Discussed with: CRNA and Anesthesiologist  Anesthesia Plan Comments:           Anesthesia Quick Evaluation

## 2024-06-25 NOTE — ED Notes (Signed)
[facility] at bedside to assume care of patient. 

## 2024-06-25 NOTE — Transfer of Care (Signed)
 Immediate Anesthesia Transfer of Care Note  Patient: Heather Solis  Procedure(s) Performed: APPENDECTOMY, LAPAROSCOPIC (Abdomen)  Patient Location: PACU  Anesthesia Type:General  Level of Consciousness: drowsy  Airway & Oxygen Therapy: Patient Spontanous Breathing  Post-op Assessment: Report given to RN  Post vital signs: Reviewed and stable  Last Vitals:  Vitals Value Taken Time  BP 126/70 06/25/24 18:09  Temp    Pulse 84 06/25/24 18:10  Resp 22 06/25/24 18:10  SpO2 96 % 06/25/24 18:10  Vitals shown include unfiled device data.  Last Pain:  Vitals:   06/25/24 1130  TempSrc:   PainSc: 1          Complications: No notable events documented.

## 2024-06-25 NOTE — ED Notes (Signed)
 Pt changed into hospital gown & socks. All belongings in bag to go home w/ family. Pt preformed CHG bath & brushed teeth.

## 2024-06-25 NOTE — ED Notes (Signed)
 Ambulated to BR.

## 2024-06-25 NOTE — Anesthesia Postprocedure Evaluation (Signed)
 Anesthesia Post Note  Patient: Heather Solis  Procedure(s) Performed: APPENDECTOMY, LAPAROSCOPIC (Abdomen)     Patient location during evaluation: PACU Anesthesia Type: General Level of consciousness: awake and alert Pain management: pain level controlled Vital Signs Assessment: post-procedure vital signs reviewed and stable Respiratory status: spontaneous breathing, nonlabored ventilation and respiratory function stable Cardiovascular status: blood pressure returned to baseline and stable Postop Assessment: no apparent nausea or vomiting Anesthetic complications: no   No notable events documented.  Last Vitals:  Vitals:   06/25/24 1845 06/25/24 1900  BP: (!) 141/68 (!) 140/68  Pulse: 84 83  Resp: 15 14  Temp:    SpO2: 93% 95%    Last Pain:  Vitals:   06/25/24 1900  TempSrc:   PainSc: 0-No pain                 Maira Christon A.

## 2024-06-25 NOTE — H&P (Signed)
 Heather Solis is an 73 y.o. female.   Chief Complaint: Abdominal pain HPI: Heather Solis is a 73 year old white female who began having abdominal pain on Tuesday.  Heather pain was mostly located in her right lower quadrant.  She had some nausea but no vomiting associated with it.  She definitely felt tired and took a nap which is unusual for her.  Heather pain worsened so she went to Heather emergency department.  A CT scan was performed that was consistent with appendicitis with evidence of possible perforation.  She is otherwise in good health and does not smoke.  Past Medical History:  Diagnosis Date   Arthralgia 02/25/2017   Carpal tunnel syndrome on right 01/29/2017   Hypothyroid    Leukemia (HCC)    CML   Thyroid  cyst     Past Surgical History:  Procedure Laterality Date   NO PAST SURGERIES      Family History  Problem Relation Age of Onset   Lung cancer Father    Social History:  reports that she has never smoked. She has never used smokeless tobacco. She reports that she does not drink alcohol and does not use drugs.  Allergies: No Known Allergies  Medications Prior to Admission  Medication Sig Dispense Refill   acetaminophen  (TYLENOL ) 500 MG tablet Take 500 mg by mouth every 6 (six) hours as needed for moderate pain (pain score 4-6).     calcium  carbonate (CALCIUM  600) 600 MG TABS tablet Take 1 tablet by mouth daily.     cetirizine (ZYRTEC) 10 MG tablet Take 10 mg by mouth daily.     levothyroxine  (SYNTHROID , LEVOTHROID) 112 MCG tablet      HYDROmorphone  (DILAUDID ) 2 MG tablet Take 1 tablet (2 mg total) by mouth every 6 (six) hours as needed for severe pain. (Solis not taking: Reported on 05/01/2017) 50 tablet 0   Naproxen  Sodium (ALEVE  PO) Take by mouth.      Results for orders placed or performed during Heather hospital encounter of 06/24/24 (from Heather past 48 hours)  Urinalysis, Routine w reflex microscopic -Urine, Clean Catch     Status: Abnormal   Collection Time: 06/24/24  3:40  PM  Result Value Ref Range   Color, Urine YELLOW YELLOW   APPearance CLEAR CLEAR   Specific Gravity, Urine 1.015 1.005 - 1.030   pH 6.5 5.0 - 8.0   Glucose, UA NEGATIVE NEGATIVE mg/dL   Hgb urine dipstick [place] (A) NEGATIVE   Bilirubin Urine NEGATIVE NEGATIVE   Ketones, ur NEGATIVE NEGATIVE mg/dL   Protein, ur NEGATIVE NEGATIVE mg/dL   Nitrite NEGATIVE NEGATIVE   Leukocytes,Ua [place] (A) NEGATIVE    Comment: Performed at [facility], [address] [place], [place] [address]  Urinalysis, Microscopic (reflex)     Status: Abnormal   Collection Time: 06/24/24  3:40 PM  Result Value Ref Range   RBC / HPF 6-10 0 - 5 RBC/hpf   WBC, UA 6-10 0 - 5 WBC/hpf   Bacteria, UA RARE (A) NONE SEEN   Squamous Epithelial / HPF 0-5 0 - 5 /HPF    Comment: Performed at [facility], [address] [place], [place] [address]  Lipase, blood     Status: None   Collection Time: 06/24/24  3:42 PM  Result Value Ref Range   Lipase 17 11 - 51 U/L    Comment: Performed at [facility], [address] [place], [place] [address]  Comprehensive metabolic panel     Status: Abnormal   Collection Time: 06/24/24  3:42 PM  Result Value Ref Range   Sodium 137 135 - 145 mmol/L   Potassium 4.6 3.5 - 5.1 mmol/L    Comment: HEMOLYSIS AT THIS LEVEL MAY AFFECT RESULT   Chloride 99 98 - 111 mmol/L   CO2 26 22 - 32 mmol/L   Glucose, Bld 104 (H) 70 - 99 mg/dL    Comment: Glucose reference range applies only to samples taken after fasting for at least 8 hours.   BUN 12 8 - 23 mg/dL   Creatinine, Ser 9.31 0.44 - 1.00 mg/dL   Calcium  9.7 8.9 - 10.3 mg/dL   Total Protein 8.0 6.5 - 8.1 g/dL   Albumin 4.4 3.5 - 5.0 g/dL   AST 25 15 - 41 U/L   ALT 17 0 - 44 U/L   Alkaline Phosphatase 65 38 - 126 U/L   Total Bilirubin 1.1 0.0 - 1.2 mg/dL   GFR, Estimated >39 >39 mL/min    Comment: (NOTE) Calculated using Heather CKD-EPI Creatinine Equation (2021)    Anion gap 12 5 - 15    Comment: Performed  at [facility], [address] [place], [place] [address]  CBC     Status: Abnormal   Collection Time: 06/24/24  3:42 PM  Result Value Ref Range   WBC 14.4 (H) 4.0 - 10.5 K/uL   RBC 4.50 3.87 - 5.11 MIL/uL   Hemoglobin 14.2 12.0 - 15.0 g/dL   HCT 57.5 63.9 - 53.9 %   MCV 94.2 80.0 - 100.0 fL   MCH 31.6 26.0 - 34.0 pg   MCHC 33.5 30.0 - 36.0 g/dL   RDW 87.3 88.4 - 84.4 %   Platelets 306 150 - 400 K/uL   nRBC 0.0 0.0 - 0.2 %    Comment: Performed at [facility], [address] [place], [place] [address]   CT ABDOMEN PELVIS W CONTRAST Result Date: 06/24/2024 CLINICAL DATA:  Right lower quadrant pain.  Nausea. EXAM: CT ABDOMEN AND PELVIS WITH CONTRAST TECHNIQUE: Multidetector CT imaging of Heather abdomen and pelvis was performed using Heather standard protocol following bolus administration of intravenous contrast. RADIATION DOSE REDUCTION: This exam was performed according to Heather departmental dose-optimization program which includes automated exposure control, adjustment of the mA and/or kV according to Solis size and/or use of iterative reconstruction technique. CONTRAST:  OMNIPAQUE  IOHEXOL  300 MG/ML  SOLN COMPARISON:  CT 12/04/2017 FINDINGS: Lower chest: Abeln to moderate hiatal hernia. Mild subpleural ground-glass may be related to scarring. No focal airspace disease. No pleural effusion. Hepatobiliary: No suspicious liver lesion. Gallbladder physiologically distended, no calcified stone. No biliary dilatation. Pancreas: No ductal dilatation or inflammation. Spleen: Normal in size without focal abnormality. Adrenals/Urinary Tract: Normal adrenal glands. No hydronephrosis or perinephric edema. Homogeneous renal enhancement with symmetric excretion on delayed phase imaging. No renal calculi or suspicious renal abnormality. Urinary bladder is physiologically distended without wall thickening. Stomach/Bowel: Heather appendix is dilated at 14 mm with wall thickening and prominent  periappendiceal fat stranding. At Heather appendiceal tip is a 2.4 x 1.9 cm fluid collection, series 2, image 52 containing an appendicolith suspicious for abscess. There is cecal wall thickening at Heather appendiceal insertion. Moderate colonic stool burden with diverticulosis throughout Heather colon. No discrete diverticulitis. There is no Sanderfer bowel obstruction, inflammation or ileus. [place]oia to moderate-sized hiatal hernia. Vascular/Lymphatic: Aortic atherosclerosis. No aneurysm. Portal vein is patent. Reproductive:  Prominent bilateral periuterine and adnexal vascularity with dilatation of Heather ovarian veins 7 mm on Heather left and 7 mm on Heather right. Prominent lymph nodes in Heather ileocolic chain are likely reactive, coronal series 8, images 54 through 57. Other: Stranding in Heather right lower quadrant related to inflamed appendix with trace free fluid. There is no free intra-abdominal air. Musculoskeletal: Compression fractures at T11, L1, L2, and L4, without significant interval change. Compression fracture of T10 is new from 2018 but appears chronic. Prominent Schmorl's node within superior endplate of L3. IMPRESSION: 1. Acute appendicitis. 2.4 x 1.9 cm fluid collection at Heather appendiceal tip containing an appendicolith suspicious for abscess. 2. Prominent lymph nodes in Heather ileocolic chain are likely reactive. 3. Colonic diverticulosis without diverticulitis. 4. Mastropietro to moderate-sized hiatal hernia. 5. Prominent bilateral periuterine and adnexal vascularity with dilatation of Heather ovarian veins, can be seen with pelvic congestion syndrome in Heather appropriate clinical setting. Aortic Atherosclerosis (ICD10-I70.0). Electronically Signed   By: Andrea Gasman M.D.   On: 06/24/2024 20:52    Review of Systems  Constitutional: Negative.   HENT: Negative.    Eyes: Negative.   Respiratory: Negative.    Cardiovascular: Negative.   Gastrointestinal:  Positive for abdominal pain and nausea. Negative for vomiting.  Endocrine:  Negative.   Genitourinary: Negative.   Musculoskeletal: Negative.   Skin: Negative.   Allergic/Immunologic: Negative.   Neurological: Negative.   Hematological: Negative.   Psychiatric/Behavioral: Negative.      Blood pressure (!) 147/70, pulse 81, temperature 98.3 F (36.8 C), temperature source Oral, resp. rate 12, height 5' 3 (1.6 m), weight 58.5 kg, SpO2 96%. Physical Exam Vitals reviewed.  Constitutional:      General: She is not in acute distress.    Appearance: Normal appearance. She is normal weight.  HENT:     Head: Normocephalic and atraumatic.     Right Ear: External ear normal.     Left Ear: External ear normal.     Nose: Nose normal.     Mouth/Throat:     Mouth: Mucous membranes are moist.     Pharynx: Oropharynx is clear.  Eyes:     General: No scleral icterus.    Extraocular Movements: Extraocular movements intact.     Conjunctiva/sclera: Conjunctivae normal.     Pupils: Pupils are equal, round, and reactive to light.  Cardiovascular:     Rate and Rhythm: Normal rate and regular rhythm.     Pulses: Normal pulses.     Heart sounds: Normal heart sounds.  Pulmonary:     Effort: Pulmonary effort is normal. No respiratory distress.     Breath sounds: Normal breath sounds.  Abdominal:     General: Abdomen is flat.     Palpations: Abdomen is soft.     Comments: There is mild to moderate right lower quadrant focal tenderness  Musculoskeletal:        General: No swelling or deformity. Normal range of motion.     Cervical back: Normal range of motion and neck supple.  Skin:    General: Skin is warm and dry.     Coloration: Skin is not jaundiced.  Neurological:     General: No focal deficit present.     Mental Status: She is alert and oriented to person, place, and time.  Psychiatric:        Mood and Affect: Mood normal.        Behavior: Behavior normal.      Assessment/Plan Heather Solis  appears to have an acute appendicitis with some evidence of possible  rupture.  Because of Heather risk of sepsis I feel she would benefit from having her appendix removed.  She would also like to have this done.  I have discussed with her in detail Heather risks and benefits of Heather operation as well as some of Heather technical aspects and she understands and wishes to proceed.  We will plan for laparoscopic appendectomy later today  Deward Null III, MD 06/25/2024, 12:17 PM

## 2024-06-25 NOTE — ED Notes (Signed)
 ED TO INPATIENT HANDOFF REPORT  ED Nurse Name and Phone #: Thornell PEAK [phone]  S Name/Age/Gender Heather Solis 73 y.o. female Room/Bed: MH06/MH06  Code Status   Code Status: Not on file  Home/SNF/Other Home Patient oriented to: self, place, time, and situation Is this baseline? Yes   Triage Complete: Triage complete  Chief Complaint abd pain  Triage Note Pt POV c/o RLQ pain since Tuesday, sent by pcp.  Denies n/v/d. Reports pain has improved since initial onset, but reports still feeling overall unwell.  Reports nausea yesterday AM.  Moderate po intake.   Pt also reports decreased urinary frequency.     Allergies No Known Allergies  Level of Care/Admitting Diagnosis ED Disposition     ED Disposition  Admit   Condition  --   Comment  The patient appears reasonably stabilized for admission considering the current resources, flow, and capabilities available in the ED at this time, and I doubt any other [facility] requiring further screening and/or treatment in the ED prior to admission is  present.          B Medical/Surgery History Past Medical History:  Diagnosis Date   Arthralgia 02/25/2017   Carpal tunnel syndrome on right 01/29/2017   Hypothyroid    Leukemia (HCC)    CML   Thyroid  cyst    Past Surgical History:  Procedure Laterality Date   NO PAST SURGERIES       A IV Location/Drains/Wounds Patient Lines/Drains/Airways Status     Active Line/Drains/Airways     Name Placement date Placement time Site Days   Peripheral IV 06/24/24 20 G 1.16 Right Antecubital 06/24/24  1828  Antecubital  1            Intake/Output Last 24 hours  Intake/Output Summary (Last 24 hours) at 06/25/2024 0802 Last data filed at 06/25/2024 9571 Gross per 24 hour  Intake 1050 ml  Output --  Net 1050 ml    Labs/Imaging Results for orders placed or performed during the hospital encounter of 06/24/24 (from the past 48 hours)  Urinalysis, Routine w reflex  microscopic -Urine, Clean Catch     Status: Abnormal   Collection Time: 06/24/24  3:40 PM  Result Value Ref Range   Color, Urine YELLOW YELLOW   APPearance CLEAR CLEAR   Specific Gravity, Urine 1.015 1.005 - 1.030   pH 6.5 5.0 - 8.0   Glucose, UA NEGATIVE NEGATIVE mg/dL   Hgb urine dipstick Hiltunen (A) NEGATIVE   Bilirubin Urine NEGATIVE NEGATIVE   Ketones, ur NEGATIVE NEGATIVE mg/dL   Protein, ur NEGATIVE NEGATIVE mg/dL   Nitrite NEGATIVE NEGATIVE   Leukocytes,Ua Mcgillis (A) NEGATIVE    Comment: Performed at [facility], [address] [place], [place] [address]  Urinalysis, Microscopic (reflex)     Status: Abnormal   Collection Time: 06/24/24  3:40 PM  Result Value Ref Range   RBC / HPF 6-10 0 - 5 RBC/hpf   WBC, UA 6-10 0 - 5 WBC/hpf   Bacteria, UA RARE (A) NONE SEEN   Squamous Epithelial / HPF 0-5 0 - 5 /HPF    Comment: Performed at [facility], [address] [place], [place] [address]  Lipase, blood     Status: None   Collection Time: 06/24/24  3:42 PM  Result Value Ref Range   Lipase 17 11 - 51 U/L    Comment: Performed at [facility], [address] [place], [place]  [address]  Comprehensive metabolic panel     Status: Abnormal   Collection Time: 06/24/24  3:42 PM  Result Value Ref Range   Sodium 137 135 - 145 mmol/L   Potassium 4.6 3.5 - 5.1 mmol/L    Comment: HEMOLYSIS AT THIS LEVEL MAY AFFECT RESULT   Chloride 99 98 - 111 mmol/L   CO2 26 22 - 32 mmol/L   Glucose, Bld 104 (H) 70 - 99 mg/dL    Comment: Glucose reference range applies only to samples taken after fasting for at least 8 hours.   BUN 12 8 - 23 mg/dL   Creatinine, Ser 9.31 0.44 - 1.00 mg/dL   Calcium  9.7 8.9 - 10.3 mg/dL   Total Protein 8.0 6.5 - 8.1 g/dL   Albumin 4.4 3.5 - 5.0 g/dL   AST 25 15 - 41 U/L   ALT 17 0 - 44 U/L   Alkaline Phosphatase 65 38 - 126 U/L   Total Bilirubin 1.1 0.0 - 1.2 mg/dL   GFR, Estimated >39 >39 mL/min    Comment: (NOTE) Calculated  using the CKD-EPI Creatinine Equation (2021)    Anion gap 12 5 - 15    Comment: Performed at [facility], [address] [place], [place] [address]  CBC     Status: Abnormal   Collection Time: 06/24/24  3:42 PM  Result Value Ref Range   WBC 14.4 (H) 4.0 - 10.5 K/uL   RBC 4.50 3.87 - 5.11 MIL/uL   Hemoglobin 14.2 12.0 - 15.0 g/dL   HCT 57.5 63.9 - 53.9 %   MCV 94.2 80.0 - 100.0 fL   MCH 31.6 26.0 - 34.0 pg   MCHC 33.5 30.0 - 36.0 g/dL   RDW 87.3 88.4 - 84.4 %   Platelets 306 150 - 400 K/uL   nRBC 0.0 0.0 - 0.2 %    Comment: Performed at [facility], [address] [place], [place] [address]   CT ABDOMEN PELVIS W CONTRAST Result Date: 06/24/2024 CLINICAL DATA:  Right lower quadrant pain.  Nausea. EXAM: CT ABDOMEN AND PELVIS WITH CONTRAST TECHNIQUE: Multidetector CT imaging of the abdomen and pelvis was performed using the standard protocol following bolus administration of intravenous contrast. RADIATION DOSE REDUCTION: This exam was performed according to the departmental dose-optimization program which includes automated exposure control, adjustment of the mA and/or kV according to patient size and/or use of iterative reconstruction technique. CONTRAST:  OMNIPAQUE  IOHEXOL  300 MG/ML  SOLN COMPARISON:  CT 12/04/2017 FINDINGS: Lower chest: [place] to moderate hiatal hernia. Mild subpleural ground-glass may be related to scarring. No focal airspace disease. No pleural effusion. Hepatobiliary: No suspicious liver lesion. Gallbladder physiologically distended, no calcified stone. No biliary dilatation. Pancreas: No ductal dilatation or inflammation. Spleen: Normal in size without focal abnormality. Adrenals/Urinary Tract: Normal adrenal glands. No hydronephrosis or perinephric edema. Homogeneous renal enhancement with symmetric excretion on delayed phase imaging. No renal calculi or suspicious renal abnormality. Urinary bladder is physiologically distended without wall  thickening. Stomach/Bowel: The appendix is dilated at 14 mm with wall thickening and prominent periappendiceal fat stranding. At the appendiceal tip is a 2.4 x 1.9 cm fluid collection, series 2, image 52 containing an appendicolith suspicious for abscess. There is cecal wall thickening at the appendiceal insertion. Moderate colonic stool burden with diverticulosis throughout the colon. No discrete diverticulitis. There is no Fannin bowel obstruction, inflammation or ileus. Forbush to moderate-sized hiatal hernia. Vascular/Lymphatic: Aortic atherosclerosis. No aneurysm. Portal vein is  patent. Reproductive: Prominent bilateral periuterine and adnexal vascularity with dilatation of the ovarian veins 7 mm on the left and 7 mm on the right. Prominent lymph nodes in the ileocolic chain are likely reactive, coronal series 8, images 54 through 57. Other: Stranding in the right lower quadrant related to inflamed appendix with trace free fluid. There is no free intra-abdominal air. Musculoskeletal: Compression fractures at T11, L1, L2, and L4, without significant interval change. Compression fracture of T10 is new from 2018 but appears chronic. Prominent Schmorl's node within superior endplate of L3. IMPRESSION: 1. Acute appendicitis. 2.4 x 1.9 cm fluid collection at the appendiceal tip containing an appendicolith suspicious for abscess. 2. Prominent lymph nodes in the ileocolic chain are likely reactive. 3. Colonic diverticulosis without diverticulitis. 4. Loscalzo to moderate-sized hiatal hernia. 5. Prominent bilateral periuterine and adnexal vascularity with dilatation of the ovarian veins, can be seen with pelvic congestion syndrome in the appropriate clinical setting. Aortic Atherosclerosis (ICD10-I70.0). Electronically Signed   By: Andrea Gasman M.D.   On: 06/24/2024 20:52    Pending Labs Unresulted Labs (From admission, onward)    None       Vitals/Pain Today's Vitals   06/25/24 0157 06/25/24 0425 06/25/24  0727 06/25/24 0800  BP:  130/69 122/65   Pulse:  78 83   Resp:  16 18   Temp:  98 F (36.7 C) 98.1 F (36.7 C)   TempSrc:   Oral   SpO2:  100% 99%   Weight:      Height:      PainSc: 3    2     Isolation Precautions No active isolations  Medications Medications  piperacillin -tazobactam (ZOSYN ) IVPB 3.375 g (3.375 g Intravenous New Bag/Given 06/25/24 0529)  acetaminophen  (TYLENOL ) tablet 1,000 mg (has no administration in time range)  gabapentin  (NEURONTIN ) capsule 300 mg (has no administration in time range)  cefoTEtan  (CEFOTAN ) 2 g in sodium chloride  0.9 % 100 mL IVPB (has no administration in time range)  lactated ringers  bolus 1,000 mL (has no administration in time range)  HYDROmorphone  (DILAUDID ) injection 0.5-2 mg (has no administration in time range)  acetaminophen  (TYLENOL ) tablet 325-650 mg (650 mg Oral Given 06/25/24 0103)  acetaminophen  (TYLENOL ) suppository 650 mg (has no administration in time range)  oxyCODONE  (ROXICODONE ) 5 MG/5ML solution 5-10 mg (has no administration in time range)  methocarbamol  (ROBAXIN ) injection 1,000 mg (has no administration in time range)  methocarbamol  (ROBAXIN ) tablet 1,000 mg (has no administration in time range)  ondansetron  (ZOFRAN ) injection 4 mg (has no administration in time range)  prochlorperazine  (COMPAZINE ) injection 5-10 mg (has no administration in time range)  phenol (CHLORASEPTIC) mouth spray 2 spray (has no administration in time range)  menthol -cetylpyridinium (CEPACOL) lozenge 3 mg (has no administration in time range)  magic mouthwash (has no administration in time range)  alum & mag hydroxide-simeth (MAALOX/MYLANTA) 200-200-20 MG/5ML suspension 30 mL (has no administration in time range)  simethicone  (MYLICON) 40 MG/0.6ML suspension 80 mg (has no administration in time range)  psyllium (HYDROCIL/METAMUCIL) 1 packet (has no administration in time range)  diphenhydrAMINE  (BENADRYL ) injection 12.5-25 mg (has no  administration in time range)  sodium chloride  (OCEAN) 0.65 % nasal spray 1-2 spray (has no administration in time range)  naphazoline-glycerin  (CLEAR EYES REDNESS) ophth solution 1-2 drop (has no administration in time range)  metoprolol  tartrate (LOPRESSOR ) injection 5 mg (has no administration in time range)  iohexol  (OMNIPAQUE ) 300 MG/ML solution 100 mL (100 mLs Intravenous Contrast Given 06/24/24 2020)  Chlorhexidine  Gluconate Cloth 2 % PADS 6 each (6 each Topical Given 06/25/24 0104)    And  Chlorhexidine  Gluconate Cloth 2 % PADS 6 each (6 each Topical Given 06/25/24 0736)  lactated ringers  bolus 1,000 mL (0 mLs Intravenous Stopped 06/25/24 0428)    Mobility walks     Focused Assessments     R Recommendations: See Admitting Provider Note  Report given to: Larraine, RN PACU  Additional Notes:

## 2024-06-25 NOTE — Anesthesia Procedure Notes (Signed)
 Procedure Name: Intubation Date/Time: 06/25/2024 4:24 PM  Performed by: Delores Duwaine SAUNDERS, CRNAPre-anesthesia Checklist: Patient identified, Emergency Drugs available, Suction available and Patient being monitored Patient Re-evaluated:Patient Re-evaluated prior to induction Oxygen Delivery Method: Circle System Utilized Preoxygenation: Pre-oxygenation with 100% oxygen Induction Type: IV induction Ventilation: Mask ventilation without difficulty Laryngoscope Size: Glidescope and 3 Grade View: Grade I Tube type: Oral Number of attempts: 1 Airway Equipment and Method: Stylet and Oral airway Placement Confirmation: ETT inserted through vocal cords under direct vision, positive ETCO2 and breath sounds checked- equal and bilateral Secured at: 21 cm Tube secured with: Tape Dental Injury: Teeth and Oropharynx as per pre-operative assessment

## 2024-06-25 NOTE — Op Note (Signed)
 06/24/2024 - 06/25/2024  5:54 PM  PATIENT:  Heather Solis  73 y.o. female  PRE-OPERATIVE DIAGNOSIS:  appendicitis  POST-OPERATIVE DIAGNOSIS:  perforated appendicitis  PROCEDURE:  Procedure(s): APPENDECTOMY, LAPAROSCOPIC (N/A)  SURGEON:  Surgeons and Role:    DEWAINE Curvin Deward DOUGLAS, MD - Primary  PHYSICIAN ASSISTANT:   ASSISTANTS: none   ANESTHESIA:   local and general  EBL:  5 mL   BLOOD ADMINISTERED:none  DRAINS: none   LOCAL MEDICATIONS USED:  MARCAINE      SPECIMEN:  Source of Specimen:  appendix  DISPOSITION OF SPECIMEN:  PATHOLOGY  COUNTS:  YES  TOURNIQUET:  * No tourniquets in log *  DICTATION: .Dragon Dictation  After informed consent was obtained patient was brought to the operating room placed in the supine position on the operating room table. After adequate induction of general anesthesia the patient's abdomen was prepped with ChloraPrep, allowed to dry, and draped in usual sterile manner. The area below the umbilicus was infiltrated with quarter percent Marcaine . A Claude incision was made with a 15 blade knife. This incision was carried down through the subcutaneous tissue bluntly with a hemostat and Army-Navy retractors until the linea alba was identified. The linea alba was incised with a 15 blade knife. Each side was grasped Coker clamps and elevated anteriorly. The preperitoneal space was probed bluntly with a hemostat until the peritoneum was opened and access was gained to the abdominal cavity. A 0 Vicryl purse string stitch was placed in the fascia surrounding the opening. A Hassan cannula was placed through the opening and anchored in place with the previously placed Vicryl purse string stitch. The laparoscope was placed through the [facility] cannula. The abdomen was insufflated with carbon dioxide without difficulty. Next the suprapubic area was infiltrated with quarter percent Marcaine . A Silvera incision was made with a 15 blade knife. A 5 mm port was placed  bluntly through this incision into the abdominal cavity. A site was then chosen between the 2 port for placement of a 5 mm port. The area was infiltrated with quarter percent Marcaine . A Reinwald stab incision was made with a 15 blade knife. A 5 mm port was placed bluntly through this incision and the abdominal cavity under direct vision. The laparoscope was then moved to the suprapubic port. Using a Glassman grasper and harmonic scalpel the right lower quadrant was inspected.  The right lower quadrant was a large ball of induration.  I was able to probe this with a blunt sucker tip and I was able to identify an abscess cavity.  After a lot of tedious blunt dissection I was able to identify the course of the appendix.  I was able to identify where the appendix joined the cecum.  I was able to make a window around the base of the appendix.  I then fired a Echelon black load stapler 4 times to get across the base of the appendix and edge of the cecum.  Care was taken to avoid the area of the terminal ileum and ileocecal valve.  After that the appendix with the abscess cavity was free.  The staple lines appeared healthy and viable.  A laparoscopic bag was then inserted through the [facility] cannula. The appendix was placed within the bag and the bag was sealed. The abdomen was then irrigated with copious amounts of saline until the effluent was clear. No other abnormalities were noted. The appendix and bag were removed with the [facility] cannula through the infraumbilical port without difficulty.  The fascial defect was closed with the previously placed Vicryl pursestring stitch as well as with another interrupted 0 Vicryl figure-of-eight stitch. The rest of the ports were removed under direct vision and were found to be hemostatic. The gas was allowed to escape. The skin incisions were closed with interrupted 4-0 Monocryl subcuticular stitches. Dermabond dressings were applied. The patient tolerated the procedure well. At the  end of the case all needle sponge and instrument counts were correct. The patient was then awakened and taken to recovery in stable condition.  PLAN OF CARE: Admit for overnight observation  PATIENT DISPOSITION:  PACU - hemodynamically stable.   Delay start of Pharmacological VTE agent (>24hrs) due to surgical blood loss or risk of bleeding: no

## 2024-06-26 ENCOUNTER — Encounter (HOSPITAL_COMMUNITY): Payer: Self-pay | Admitting: General Surgery

## 2024-06-26 DIAGNOSIS — K3521 Acute appendicitis with generalized peritonitis, without perforation, with abscess: Secondary | ICD-10-CM | POA: Diagnosis present

## 2024-06-26 DIAGNOSIS — Z79890 Hormone replacement therapy (postmenopausal): Secondary | ICD-10-CM | POA: Diagnosis not present

## 2024-06-26 DIAGNOSIS — E039 Hypothyroidism, unspecified: Secondary | ICD-10-CM | POA: Diagnosis present

## 2024-06-26 DIAGNOSIS — C9210 Chronic myeloid leukemia, BCR/ABL-positive, not having achieved remission: Secondary | ICD-10-CM | POA: Diagnosis present

## 2024-06-26 DIAGNOSIS — R339 Retention of urine, unspecified: Secondary | ICD-10-CM | POA: Diagnosis not present

## 2024-06-26 DIAGNOSIS — Z801 Family history of malignant neoplasm of trachea, bronchus and lung: Secondary | ICD-10-CM | POA: Diagnosis not present

## 2024-06-26 DIAGNOSIS — K381 Appendicular concretions: Secondary | ICD-10-CM | POA: Diagnosis present

## 2024-06-26 DIAGNOSIS — K3533 Acute appendicitis with perforation and localized peritonitis, with abscess: Secondary | ICD-10-CM | POA: Diagnosis present

## 2024-06-26 LAB — CBC
HCT: 39.4 % (ref 36.0–46.0)
Hemoglobin: 13 g/dL (ref 12.0–15.0)
MCH: 31.6 pg (ref 26.0–34.0)
MCHC: 33 g/dL (ref 30.0–36.0)
MCV: 95.6 fL (ref 80.0–100.0)
Platelets: 310 K/uL (ref 150–400)
RBC: 4.12 MIL/uL (ref 3.87–5.11)
RDW: 12.7 % (ref 11.5–15.5)
WBC: 13.8 K/uL — ABNORMAL HIGH (ref 4.0–10.5)
nRBC: 0 % (ref 0.0–0.2)

## 2024-06-26 MED ORDER — LORATADINE 10 MG PO TABS
10.0000 mg | ORAL_TABLET | Freq: Every day | ORAL | Status: DC
  Administered 2024-06-26 – 2024-06-27 (×2): 10 mg via ORAL
  Filled 2024-06-26 (×2): qty 1

## 2024-06-26 MED ORDER — CALCIUM POLYCARBOPHIL 625 MG PO TABS
625.0000 mg | ORAL_TABLET | Freq: Two times a day (BID) | ORAL | Status: DC
  Administered 2024-06-26 – 2024-06-27 (×2): 625 mg via ORAL
  Filled 2024-06-26 (×2): qty 1

## 2024-06-26 MED ORDER — SODIUM CHLORIDE 0.9 % IV SOLN: 250.0000 mL | INTRAVENOUS | Status: DC | PRN

## 2024-06-26 MED ORDER — LACTATED RINGERS IV BOLUS
1000.0000 mL | Freq: Once | INTRAVENOUS | Status: AC
  Administered 2024-06-26: 1000 mL via INTRAVENOUS

## 2024-06-26 MED ORDER — ONDANSETRON HCL 4 MG/2ML IJ SOLN
4.0000 mg | Freq: Four times a day (QID) | INTRAMUSCULAR | Status: DC
  Filled 2024-06-26: qty 2

## 2024-06-26 MED ORDER — ORAL CARE MOUTH RINSE: 15.0000 mL | OROMUCOSAL | Status: DC | PRN

## 2024-06-26 MED ORDER — LEVOTHYROXINE SODIUM 25 MCG PO TABS
137.0000 ug | ORAL_TABLET | Freq: Every day | ORAL | Status: DC
  Administered 2024-06-27: 137 ug via ORAL
  Filled 2024-06-26: qty 1

## 2024-06-26 MED ORDER — SODIUM CHLORIDE 0.9% FLUSH
3.0000 mL | Freq: Two times a day (BID) | INTRAVENOUS | Status: DC
  Administered 2024-06-26: 3 mL via INTRAVENOUS

## 2024-06-26 MED ORDER — ACETAMINOPHEN 500 MG PO TABS
1000.0000 mg | ORAL_TABLET | Freq: Four times a day (QID) | ORAL | Status: DC
  Administered 2024-06-26 – 2024-06-27 (×3): 1000 mg via ORAL
  Filled 2024-06-26 (×3): qty 2

## 2024-06-26 MED ORDER — BISACODYL 10 MG RE SUPP: 10.0000 mg | Freq: Two times a day (BID) | RECTAL | Status: DC | PRN

## 2024-06-26 MED ORDER — SODIUM CHLORIDE 0.9% FLUSH: 3.0000 mL | INTRAVENOUS | Status: DC | PRN

## 2024-06-26 MED ORDER — NAPROXEN 250 MG PO TABS: 500.0000 mg | ORAL_TABLET | Freq: Two times a day (BID) | ORAL | Status: DC | PRN

## 2024-06-26 MED ORDER — PANTOPRAZOLE SODIUM 40 MG PO TBEC
40.0000 mg | DELAYED_RELEASE_TABLET | Freq: Every day | ORAL | Status: DC
  Administered 2024-06-26: 40 mg via ORAL
  Filled 2024-06-26: qty 1

## 2024-06-26 MED ORDER — LACTATED RINGERS IV BOLUS: 1000.0000 mL | Freq: Three times a day (TID) | INTRAVENOUS | Status: DC | PRN

## 2024-06-26 MED ORDER — CHLORHEXIDINE GLUCONATE CLOTH 2 % EX PADS: 6.0000 | MEDICATED_PAD | Freq: Every day | CUTANEOUS | Status: DC

## 2024-06-26 MED ORDER — CALCIUM CARBONATE 1250 (500 CA) MG PO TABS
1.0000 | ORAL_TABLET | Freq: Every day | ORAL | Status: DC
  Administered 2024-06-27: 1250 mg via ORAL
  Filled 2024-06-26: qty 1

## 2024-06-26 NOTE — Plan of Care (Signed)
  Problem: Education: Goal: Knowledge of General Education information will improve Description: Including pain rating scale, medication(s)/side effects and non-pharmacologic comfort measures Outcome: Progressing   Problem: Health Behavior/Discharge Planning: Goal: Ability to manage health-related needs will improve Outcome: Progressing   Problem: Clinical Measurements: Goal: Ability to maintain clinical measurements within normal limits will improve Outcome: Progressing Goal: Will remain free from infection Outcome: Progressing Goal: Respiratory complications will improve Outcome: Progressing Goal: Cardiovascular complication will be avoided Outcome: Progressing   Problem: Activity: Goal: Risk for activity intolerance will decrease Outcome: Progressing   Problem: Pain Managment: Goal: General experience of comfort will improve and/or be controlled Outcome: Progressing

## 2024-06-26 NOTE — Care Management Obs Status (Signed)
 MEDICARE OBSERVATION STATUS NOTIFICATION   Patient Details  Name: Heather Solis MRN: [id number] Date of Birth: 20-Nov-1951   Medicare Observation Status Notification Given:  Yes    Sonda Manuella Quill, RN 06/26/2024, 9:44 AM

## 2024-06-26 NOTE — TOC Initial Note (Signed)
 Transition of Care [facility]) - Initial/Assessment Note    Patient Details  Name: Heather Solis MRN: [id number] Date of Birth: 03/02/1951  [facility] CM/SW Contact:    Heather Manuella Quill, RN Phone Number: 06/26/2024, 12:11 PM  Clinical Narrative:                 Spoke w/ pt and family in room; pt says she lives at home w/ her husband; she plans to return at d/c; pt identified POC dtr Heather [phone]); family will provide transportation; pt verified insurance/PCP; she denied SDOH risks; pt says she does not have DME, HH services, or home oxygen; TOC will follow.  Expected Discharge Plan: Home/Self Care Barriers to Discharge: Continued Medical Work up   Patient Goals and CMS Choice Patient states their goals for this hospitalization and ongoing recovery are:: home CMS Medicare.gov Compare Post Acute Care list provided to:: Patient   Max ownership interest in [facility].provided to:: Patient    Expected Discharge Plan and Services   Discharge Planning Services: CM Consult   Living arrangements for the past 2 months: Single Family Home                                      Prior Living Arrangements/Services Living arrangements for the past 2 months: Single Family Home Lives with:: Spouse Patient language and need for interpreter reviewed:: Yes Do you feel safe going back to the place where you live?: Yes      Need for Family Participation in Patient Care: Yes (Comment) Care giver support system in place?: Yes (comment) Current home services:  (n/a) Criminal Activity/Legal Involvement Pertinent to Current Situation/Hospitalization: No - Comment as needed  Activities of Daily Living   ADL Screening (condition at time of admission) Independently performs ADLs?: Yes (appropriate for developmental age) Is the patient deaf or have difficulty hearing?: No Does the patient have difficulty seeing, even when wearing  glasses/contacts?: No Does the patient have difficulty concentrating, remembering, or making decisions?: No  Permission Sought/Granted Permission sought to share information with : Case Manager Permission granted to share information with : Yes, Verbal Permission Granted  Share Information with NAME: Case Manager     Permission granted to share info w Relationship: Heather Solis (dtr) [phone]     Emotional Assessment Appearance:: Appears stated age Attitude/Demeanor/Rapport: Gracious Affect (typically observed): Accepting Orientation: : Oriented to Self, Oriented to Place, Oriented to  Time, Oriented to Situation Alcohol / Substance Use: Not Applicable Psych Involvement: No (comment)  Admission diagnosis:  Acute appendicitis with generalized peritonitis and abscess, without gangrene or perforation [K35.210] Appendicitis with perforation [K35.32] Patient Active Problem List   Diagnosis Date Noted   Appendicitis with perforation 06/25/2024   Acute perforated appendicitis 06/24/2024   Pain in both hands 03/17/2017   Chronic myelocytic leukemia (HCC) 03/17/2017   Acquired hypothyroidism 03/17/2017   Thyroid  nodule 03/17/2017   Arthralgia 02/25/2017   Carpal tunnel syndrome on right 01/29/2017   PCP:  Heather Tanda Mae, MD Pharmacy:   [facility] [address], [place] - [address] [facility] [place] [address] Phone: [phone] Fax: [phone]     Social Drivers of Health (SDOH) Social History: SDOH Screenings   Food Insecurity: No Food Insecurity (06/26/2024)  Housing: Low Risk  (06/26/2024)  Transportation Needs: No Transportation Needs (06/26/2024)  Utilities: Not At Risk (06/26/2024)  Social Connections: Socially Integrated (06/25/2024)  Tobacco Use: Low Risk  (06/25/2024)   SDOH Interventions: Food Insecurity Interventions: Intervention Not Indicated, Inpatient TOC Housing Interventions: Intervention Not Indicated, Inpatient  TOC Transportation Interventions: Intervention Not Indicated, Inpatient TOC Utilities Interventions: Intervention Not Indicated, Inpatient TOC   Readmission Risk Interventions     No data to display

## 2024-06-26 NOTE — Progress Notes (Signed)
 1 Day Post-Op   Subjective/Chief Complaint: Complains of soreness. Urinary retention overnight   Objective: Vital signs in last 24 hours: Temp:  [97.5 F (36.4 C)-99.1 F (37.3 C)] 99.1 F (37.3 C) (07/05 0532) Pulse Rate:  [73-97] 97 (07/05 0532) Resp:  [10-24] 16 (07/05 0532) BP: (121-148)/(59-77) 125/67 (07/05 0532) SpO2:  [92 %-100 %] 97 % (07/05 0532)    Intake/Output from previous day: 07/04 0701 - 07/05 0700 In: 2102.4 [P.O.:720; I.V.:1183.2; IV Piggyback:199.2] Out: 1030 [Urine:1025; Blood:5] Intake/Output this shift: No intake/output data recorded.  General appearance: alert and cooperative Resp: clear to auscultation bilaterally Cardio: regular rate and rhythm GI: soft, mild tenderness.   Lab Results:  Recent Labs    06/24/24 1542 06/26/24 0420  WBC 14.4* 13.8*  HGB 14.2 13.0  HCT 42.4 39.4  PLT 306 310   BMET Recent Labs    06/24/24 1542  NA 137  K 4.6  CL 99  CO2 26  GLUCOSE 104*  BUN 12  CREATININE 0.68  CALCIUM  9.7   PT/INR No results for input(s): LABPROT, INR in the last 72 hours. ABG No results for input(s): PHART, HCO3 in the last 72 hours.  Invalid input(s): PCO2, PO2  Studies/Results: CT ABDOMEN PELVIS W CONTRAST Result Date: 06/24/2024 CLINICAL DATA:  Right lower quadrant pain.  Nausea. EXAM: CT ABDOMEN AND PELVIS WITH CONTRAST TECHNIQUE: Multidetector CT imaging of the abdomen and pelvis was performed using the standard protocol following bolus administration of intravenous contrast. RADIATION DOSE REDUCTION: This exam was performed according to the departmental dose-optimization program which includes automated exposure control, adjustment of the mA and/or kV according to patient size and/or use of iterative reconstruction technique. CONTRAST:  OMNIPAQUE  IOHEXOL  300 MG/ML  SOLN COMPARISON:  CT 12/04/2017 FINDINGS: Lower chest: Langhorst to moderate hiatal hernia. Mild subpleural ground-glass may be related to scarring.  No focal airspace disease. No pleural effusion. Hepatobiliary: No suspicious liver lesion. Gallbladder physiologically distended, no calcified stone. No biliary dilatation. Pancreas: No ductal dilatation or inflammation. Spleen: Normal in size without focal abnormality. Adrenals/Urinary Tract: Normal adrenal glands. No hydronephrosis or perinephric edema. Homogeneous renal enhancement with symmetric excretion on delayed phase imaging. No renal calculi or suspicious renal abnormality. Urinary bladder is physiologically distended without wall thickening. Stomach/Bowel: The appendix is dilated at 14 mm with wall thickening and prominent periappendiceal fat stranding. At the appendiceal tip is a 2.4 x 1.9 cm fluid collection, series 2, image 52 containing an appendicolith suspicious for abscess. There is cecal wall thickening at the appendiceal insertion. Moderate colonic stool burden with diverticulosis throughout the colon. No discrete diverticulitis. There is no Ginty bowel obstruction, inflammation or ileus. Gange to moderate-sized hiatal hernia. Vascular/Lymphatic: Aortic atherosclerosis. No aneurysm. Portal vein is patent. Reproductive: Prominent bilateral periuterine and adnexal vascularity with dilatation of the ovarian veins 7 mm on the left and 7 mm on the right. Prominent lymph nodes in the ileocolic chain are likely reactive, coronal series 8, images 54 through 57. Other: Stranding in the right lower quadrant related to inflamed appendix with trace free fluid. There is no free intra-abdominal air. Musculoskeletal: Compression fractures at T11, L1, L2, and L4, without significant interval change. Compression fracture of T10 is new from 2018 but appears chronic. Prominent Schmorl's node within superior endplate of L3. IMPRESSION: 1. Acute appendicitis. 2.4 x 1.9 cm fluid collection at the appendiceal tip containing an appendicolith suspicious for abscess. 2. Prominent lymph nodes in the ileocolic chain are  likely reactive. 3. Colonic diverticulosis without  diverticulitis. 4. Gathers to moderate-sized hiatal hernia. 5. Prominent bilateral periuterine and adnexal vascularity with dilatation of the ovarian veins, can be seen with pelvic congestion syndrome in the appropriate clinical setting. Aortic Atherosclerosis (ICD10-I70.0). Electronically Signed   By: Andrea Gasman M.D.   On: 06/24/2024 20:52    Anti-infectives: Anti-infectives (From admission, onward)    Start     Dose/Rate Route Frequency Ordered Stop   06/25/24 2045  piperacillin -tazobactam (ZOSYN ) IVPB 3.375 g        3.375 g 12.5 mL/hr over 240 Minutes Intravenous Every 8 hours 06/25/24 1956 07/02/24 2159   06/25/24 0600  cefoTEtan  (CEFOTAN ) 2 g in sodium chloride  0.9 % 100 mL IVPB        2 g 200 mL/hr over 30 Minutes Intravenous On call to O.R. 06/24/24 2141 06/25/24 1629   06/24/24 2200  piperacillin -tazobactam (ZOSYN ) IVPB 3.375 g  Status:  Discontinued        3.375 g 12.5 mL/hr over 240 Minutes Intravenous Every 8 hours 06/24/24 2123 06/25/24 1959       Assessment/Plan: s/p Procedure(s): APPENDECTOMY, LAPAROSCOPIC (N/A) Advance diet as tolerated Continue IV zosyn  If she can't urinate after I and O cath then she may need foley replaced later today Ambulate POD 1 difficult lap appy  LOS: 0 days    Deward Null III 06/26/2024

## 2024-06-26 NOTE — Progress Notes (Addendum)
 Patient only urinated 25ml overnight. She has tried again to urinate and unsuccessful. Bladder scanned patient and 553 ml shown. Notified Dr. Curvin. New order to place foley. Will place and continue to monitor.   Went to room to discuss with patient and daughter and they stated they would prefer to I&O cath. Spoke to Dr. Curvin and order has been changed. Will I&O cath and continue to monitor.

## 2024-06-27 LAB — CBC
HCT: 33.9 % — ABNORMAL LOW (ref 36.0–46.0)
Hemoglobin: 11.2 g/dL — ABNORMAL LOW (ref 12.0–15.0)
MCH: 31.4 pg (ref 26.0–34.0)
MCHC: 33 g/dL (ref 30.0–36.0)
MCV: 95 fL (ref 80.0–100.0)
Platelets: 295 K/uL (ref 150–400)
RBC: 3.57 MIL/uL — ABNORMAL LOW (ref 3.87–5.11)
RDW: 12.6 % (ref 11.5–15.5)
WBC: 10.8 K/uL — ABNORMAL HIGH (ref 4.0–10.5)
nRBC: 0 % (ref 0.0–0.2)

## 2024-06-27 LAB — CREATININE, SERUM
Creatinine, Ser: 0.58 mg/dL (ref 0.44–1.00)
GFR, Estimated: >60 mL/min (ref >60)

## 2024-06-27 LAB — POTASSIUM: Potassium: 3.2 mmol/L — ABNORMAL LOW (ref 3.5–5.1)

## 2024-06-27 MED ORDER — AMOXICILLIN-POT CLAVULANATE 875-125 MG PO TABS
1.0000 | ORAL_TABLET | Freq: Two times a day (BID) | ORAL | 0 refills | Status: AC
Start: 2024-06-27 — End: ?

## 2024-06-27 MED ORDER — OXYCODONE HCL 5 MG PO TABS
5.0000 mg | ORAL_TABLET | Freq: Four times a day (QID) | ORAL | 0 refills | Status: AC | PRN
Start: 2024-06-27 — End: 2025-06-27

## 2024-06-27 NOTE — Discharge Summary (Signed)
 Physician Discharge Summary  Patient ID: Heather Solis MRN: [id number] DOB/AGE: 1951-10-22 73 y.o.  Admit date: 06/24/2024 Discharge date: 06/27/2024  Admission Diagnoses:  Discharge Diagnoses:  Principal Problem:   Acute perforated appendicitis Active Problems:   Chronic myelocytic leukemia (HCC)   Acquired hypothyroidism   Appendicitis with perforation   Discharged Condition: good  Hospital Course: The patient underwent laparoscopic appendectomy on postop day 2 her pain was under control and her infection parameters have normalized.  She was able to tolerate a diet.  She was ready for discharge home.  Consults: None  Significant Diagnostic Studies: none  Treatments: surgery: As above  Discharge Exam: Blood pressure 115/68, pulse 73, temperature 97.7 F (36.5 C), temperature source Oral, resp. rate 16, height 5' 3 (1.6 m), weight 58.5 kg, SpO2 98%. GI: Soft with mild tenderness  Disposition: Discharge disposition: 01-Home or Self Care       Discharge Instructions     Call MD for:  difficulty breathing, headache or visual disturbances   Complete by: As directed    Call MD for:  extreme fatigue   Complete by: As directed    Call MD for:  hives   Complete by: As directed    Call MD for:  persistant dizziness or light-headedness   Complete by: As directed    Call MD for:  persistant nausea and vomiting   Complete by: As directed    Call MD for:  redness, tenderness, or signs of infection (pain, swelling, redness, odor or green/yellow discharge around incision site)   Complete by: As directed    Call MD for:  severe uncontrolled pain   Complete by: As directed    Call MD for:  temperature >100.4   Complete by: As directed    Diet - low sodium heart healthy   Complete by: As directed    Discharge instructions   Complete by: As directed    May shower.  Diet as tolerated.  Do not lift more than 10 pounds for the next 6 weeks.   Increase activity slowly    Complete by: As directed    No wound care   Complete by: As directed       Allergies as of 06/27/2024   No Known Allergies      Medication List     TAKE these medications    acetaminophen  500 MG tablet Commonly known as: TYLENOL  Take 500 mg by mouth every 6 (six) hours as needed for moderate pain (pain score 4-6).   ALEVE  PO Take 1 tablet by mouth daily as needed (pain).   amoxicillin -clavulanate 875-125 MG tablet Commonly known as: AUGMENTIN  Take 1 tablet by mouth 2 (two) times daily.   Calcium  600 600 MG Tabs tablet Generic drug: calcium  carbonate Take 1 tablet by mouth daily.   cetirizine 10 MG tablet Commonly known as: ZYRTEC Take 10 mg by mouth daily.   levothyroxine  137 MCG tablet Commonly known as: SYNTHROID  Take 137 mcg by mouth daily before breakfast.   oxyCODONE  5 MG immediate release tablet Commonly known as: Roxicodone  Take 1 tablet (5 mg total) by mouth every 6 (six) hours as needed.        Follow-up Information     Curvin Mt III, MD Follow up in 2 week(s).   Specialty: General Surgery Contact information: [address] [employer] [address] [place] [address] [phone]                 Signed: Mt Curvin III  06/27/2024, 11:37 AM

## 2024-06-27 NOTE — TOC Transition Note (Signed)
 Transition of Care [facility]) - Discharge Note   Patient Details  Name: Heather Solis MRN: [id number] Date of Birth: 12-29-1950  Transition of [facility] CM/SW Contact:  Sonda Manuella Quill, RN Phone Number: 06/27/2024, 8:37 AM   Clinical Narrative:    D/C orders received; no TOC needs.   Final next level of care: Home/Self Care Barriers to Discharge: No Barriers Identified   Patient Goals and CMS Choice Patient states their goals for this hospitalization and ongoing recovery are:: home CMS Medicare.gov Compare Post Acute Care list provided to:: Patient   [place] ownership interest in [facility].provided to:: Patient    Discharge Placement                       Discharge Plan and Services Additional resources added to the After Visit Summary for     Discharge Planning Services: CM Consult                                 Social Drivers of Health (SDOH) Interventions SDOH Screenings   Food Insecurity: No Food Insecurity (06/26/2024)  Housing: Low Risk  (06/26/2024)  Transportation Needs: No Transportation Needs (06/26/2024)  Utilities: Not At Risk (06/26/2024)  Social Connections: Socially Integrated (06/25/2024)  Tobacco Use: Low Risk  (06/25/2024)     Readmission Risk Interventions     No data to display

## 2024-06-27 NOTE — Progress Notes (Signed)
 IV removed from RAC, site clean dry and intact.

## 2024-06-27 NOTE — Progress Notes (Signed)
 2 Days Post-Op   Subjective/Chief Complaint: No complaints.  Tolerating soft diet.  Had a couple bowel movements.  She states she is ready to go home   Objective: Vital signs in last 24 hours: Temp:  [97.7 F (36.5 C)-97.8 F (36.6 C)] 97.7 F (36.5 C) (07/06 0506) Pulse Rate:  [73-80] 73 (07/06 0506) Resp:  [16-19] 16 (07/06 0506) BP: (114-134)/(58-76) 115/68 (07/06 0506) SpO2:  [97 %-99 %] 98 % (07/06 0506)    Intake/Output from previous day: 07/05 0701 - 07/06 0700 In: 3086.4 [P.O.:1080; I.V.:858; IV Piggyback:1148.4] Out: 1300 [Urine:1300] Intake/Output this shift: Total I/O In: 30.9 [IV Piggyback:30.9] Out: -   General appearance: alert and cooperative Resp: clear to auscultation bilaterally Cardio: regular rate and rhythm GI: Soft, mild tenderness.  Incisions look good.  Lab Results:  Recent Labs    06/26/24 0420 06/27/24 0438  WBC 13.8* 10.8*  HGB 13.0 11.2*  HCT 39.4 33.9*  PLT 310 295   BMET Recent Labs    06/24/24 1542 06/27/24 0438  NA 137  --   K 4.6 3.2*  CL 99  --   CO2 26  --   GLUCOSE 104*  --   BUN 12  --   CREATININE 0.68 0.58  CALCIUM  9.7  --    PT/INR No results for input(s): LABPROT, INR in the last 72 hours. ABG No results for input(s): PHART, HCO3 in the last 72 hours.  Invalid input(s): PCO2, PO2  Studies/Results: No results found.  Anti-infectives: Anti-infectives (From admission, onward)    Start     Dose/Rate Route Frequency Ordered Stop   06/25/24 2045  piperacillin -tazobactam (ZOSYN ) IVPB 3.375 g        3.375 g 12.5 mL/hr over 240 Minutes Intravenous Every 8 hours 06/25/24 1956 06/30/24 2159   06/25/24 0600  cefoTEtan  (CEFOTAN ) 2 g in sodium chloride  0.9 % 100 mL IVPB        2 g 200 mL/hr over 30 Minutes Intravenous On call to O.R. 06/24/24 2141 06/25/24 1629   06/24/24 2200  piperacillin -tazobactam (ZOSYN ) IVPB 3.375 g  Status:  Discontinued        3.375 g 12.5 mL/hr over 240 Minutes Intravenous  Every 8 hours 06/24/24 2123 06/25/24 1959       Assessment/Plan: s/p Procedure(s): APPENDECTOMY, LAPAROSCOPIC (N/A) Advance diet Discharge  LOS: 1 day    Heather Solis 06/27/2024

## 2024-06-27 NOTE — Plan of Care (Signed)

## 2024-06-28 LAB — SURGICAL PATHOLOGY
